# Patient Record
Sex: Female | Born: 1950 | Race: White | Hispanic: No | Marital: Married | Smoking: Current every day smoker
Health system: Southern US, Community
[De-identification: ages and names within clinical notes are randomized; demographics above are authoritative.]

## PROBLEM LIST (undated history)

## (undated) DIAGNOSIS — K5790 Diverticulosis of intestine, part unspecified, without perforation or abscess without bleeding: Secondary | ICD-10-CM

## (undated) DIAGNOSIS — J439 Emphysema, unspecified: Secondary | ICD-10-CM

## (undated) DIAGNOSIS — A0472 Enterocolitis due to Clostridium difficile, not specified as recurrent: Secondary | ICD-10-CM

## (undated) DIAGNOSIS — K2270 Barrett's esophagus without dysplasia: Secondary | ICD-10-CM

## (undated) DIAGNOSIS — I10 Essential (primary) hypertension: Secondary | ICD-10-CM

## (undated) HISTORY — PX: ABDOMINAL HYSTERECTOMY: SHX81

---

## 2017-06-24 ENCOUNTER — Other Ambulatory Visit
Admission: RE | Admit: 2017-06-24 | Discharge: 2017-06-24 | Disposition: A | Discharge status: Home / Self Care | Payer: Medicare Other | Source: Ambulatory Visit | Attending: Family Medicine | Admitting: Family Medicine

## 2017-06-24 DIAGNOSIS — A0472 Enterocolitis due to Clostridium difficile, not specified as recurrent: Secondary | ICD-10-CM

## 2017-06-24 DIAGNOSIS — R197 Diarrhea, unspecified: Secondary | ICD-10-CM | POA: Diagnosis present

## 2017-06-24 HISTORY — DX: Enterocolitis due to Clostridium difficile, not specified as recurrent: A04.72

## 2017-06-24 LAB — AMYLASE: AMYLASE: 90 U/L (ref 28–100)

## 2017-06-24 LAB — COMPREHENSIVE METABOLIC PANEL
ALK PHOS: 55 U/L (ref 38–126)
ALT: 19 U/L (ref 14–54)
AST: 29 U/L (ref 15–41)
Albumin: 4.6 g/dL (ref 3.5–5.0)
Anion gap: 10 (ref 5–15)
BUN: 11 mg/dL (ref 6–20)
CHLORIDE: 97 mmol/L — AB (ref 101–111)
CO2: 26 mmol/L (ref 22–32)
CREATININE: 0.7 mg/dL (ref 0.44–1.00)
Calcium: 9.3 mg/dL (ref 8.9–10.3)
GFR calc Af Amer: >60 mL/min (ref >60)
Glucose, Bld: 184 mg/dL — ABNORMAL HIGH (ref 65–99)
Potassium: 3.6 mmol/L (ref 3.5–5.1)
Sodium: 133 mmol/L — ABNORMAL LOW (ref 135–145)
Total Bilirubin: 0.4 mg/dL (ref 0.3–1.2)
Total Protein: 7.7 g/dL (ref 6.5–8.1)

## 2017-06-24 LAB — CBC WITH DIFFERENTIAL/PLATELET
BASOS ABS: 0.1 10*3/uL (ref 0–0.1)
Basophils Relative: 1 %
Eosinophils Absolute: 0.2 10*3/uL (ref 0–0.7)
Eosinophils Relative: 2 %
HCT: 40.9 % (ref 35.0–47.0)
HEMOGLOBIN: 13.6 g/dL (ref 12.0–16.0)
LYMPHS ABS: 3.2 10*3/uL (ref 1.0–3.6)
LYMPHS PCT: 38 %
MCH: 29.8 pg (ref 26.0–34.0)
MCHC: 33.4 g/dL (ref 32.0–36.0)
MCV: 89.4 fL (ref 80.0–100.0)
Monocytes Absolute: 0.5 10*3/uL (ref 0.2–0.9)
Monocytes Relative: 6 %
NEUTROS ABS: 4.3 10*3/uL (ref 1.4–6.5)
NEUTROS PCT: 53 %
PLATELETS: 331 10*3/uL (ref 150–440)
RBC: 4.58 MIL/uL (ref 3.80–5.20)
RDW: 13.4 % (ref 11.5–14.5)
WBC: 8.2 10*3/uL (ref 3.6–11.0)

## 2017-06-24 LAB — LIPASE, BLOOD: Lipase: 33 U/L (ref 11–51)

## 2017-06-25 ENCOUNTER — Other Ambulatory Visit
Admission: RE | Admit: 2017-06-25 | Discharge: 2017-06-25 | Disposition: A | Discharge status: Home / Self Care | Payer: Medicare Other | Source: Ambulatory Visit | Attending: Family Medicine | Admitting: Family Medicine

## 2017-06-25 DIAGNOSIS — R197 Diarrhea, unspecified: Secondary | ICD-10-CM | POA: Diagnosis present

## 2017-06-25 LAB — GASTROINTESTINAL PANEL BY PCR, STOOL (REPLACES STOOL CULTURE)
ADENOVIRUS F40/41: NOT DETECTED
ASTROVIRUS: NOT DETECTED
CYCLOSPORA CAYETANENSIS: NOT DETECTED
Campylobacter species: NOT DETECTED
Cryptosporidium: NOT DETECTED
ENTEROPATHOGENIC E COLI (EPEC): NOT DETECTED
ENTEROTOXIGENIC E COLI (ETEC): NOT DETECTED
Entamoeba histolytica: NOT DETECTED
Enteroaggregative E coli (EAEC): NOT DETECTED
Giardia lamblia: NOT DETECTED
NOROVIRUS GI/GII: NOT DETECTED
PLESIMONAS SHIGELLOIDES: NOT DETECTED
Rotavirus A: NOT DETECTED
Salmonella species: NOT DETECTED
Sapovirus (I, II, IV, and V): NOT DETECTED
Shiga like toxin producing E coli (STEC): NOT DETECTED
Shigella/Enteroinvasive E coli (EIEC): NOT DETECTED
VIBRIO CHOLERAE: NOT DETECTED
VIBRIO SPECIES: NOT DETECTED
Yersinia enterocolitica: NOT DETECTED

## 2017-06-25 LAB — C DIFFICILE QUICK SCREEN W PCR REFLEX
C DIFFICILE (CDIFF) TOXIN: POSITIVE — AB
C DIFFICLE (CDIFF) ANTIGEN: POSITIVE — AB
C Diff interpretation: DETECTED

## 2017-06-30 ENCOUNTER — Encounter: Payer: Self-pay | Admitting: Emergency Medicine

## 2017-06-30 ENCOUNTER — Other Ambulatory Visit: Payer: Self-pay

## 2017-06-30 ENCOUNTER — Emergency Department: Payer: Medicare Other

## 2017-06-30 DIAGNOSIS — A0472 Enterocolitis due to Clostridium difficile, not specified as recurrent: Secondary | ICD-10-CM | POA: Insufficient documentation

## 2017-06-30 DIAGNOSIS — R1084 Generalized abdominal pain: Secondary | ICD-10-CM | POA: Diagnosis not present

## 2017-06-30 DIAGNOSIS — Z79899 Other long term (current) drug therapy: Secondary | ICD-10-CM | POA: Insufficient documentation

## 2017-06-30 DIAGNOSIS — I10 Essential (primary) hypertension: Secondary | ICD-10-CM | POA: Insufficient documentation

## 2017-06-30 DIAGNOSIS — F17210 Nicotine dependence, cigarettes, uncomplicated: Secondary | ICD-10-CM | POA: Insufficient documentation

## 2017-06-30 DIAGNOSIS — K3533 Acute appendicitis with perforation and localized peritonitis, with abscess: Secondary | ICD-10-CM | POA: Diagnosis not present

## 2017-06-30 LAB — BASIC METABOLIC PANEL
Anion gap: 10 (ref 5–15)
BUN: 8 mg/dL (ref 6–20)
CALCIUM: 10.1 mg/dL (ref 8.9–10.3)
CO2: 25 mmol/L (ref 22–32)
CREATININE: 0.6 mg/dL (ref 0.44–1.00)
Chloride: 99 mmol/L — ABNORMAL LOW (ref 101–111)
GFR calc Af Amer: >60 mL/min (ref >60)
Glucose, Bld: 90 mg/dL (ref 65–99)
Potassium: 3.3 mmol/L — ABNORMAL LOW (ref 3.5–5.1)
SODIUM: 134 mmol/L — AB (ref 135–145)

## 2017-06-30 LAB — CBC
HCT: 39.9 % (ref 35.0–47.0)
Hemoglobin: 13.5 g/dL (ref 12.0–16.0)
MCH: 30.2 pg (ref 26.0–34.0)
MCHC: 33.9 g/dL (ref 32.0–36.0)
MCV: 89.1 fL (ref 80.0–100.0)
PLATELETS: 332 10*3/uL (ref 150–440)
RBC: 4.48 MIL/uL (ref 3.80–5.20)
RDW: 13.2 % (ref 11.5–14.5)
WBC: 8.8 10*3/uL (ref 3.6–11.0)

## 2017-06-30 LAB — LIPASE, BLOOD: LIPASE: 27 U/L (ref 11–51)

## 2017-06-30 LAB — TROPONIN I: Troponin I: <0.03 ng/mL (ref <0.03)

## 2017-06-30 MED ORDER — OXYCODONE-ACETAMINOPHEN 5-325 MG PO TABS
1.0000 | ORAL_TABLET | Freq: Once | ORAL | Status: AC
Start: 2017-06-30 — End: 2017-06-30
  Administered 2017-06-30: 1 via ORAL
  Filled 2017-06-30: qty 1

## 2017-06-30 MED ORDER — ONDANSETRON 4 MG PO TBDP
4.0000 mg | ORAL_TABLET | Freq: Once | ORAL | Status: AC
  Administered 2017-06-30: 4 mg via ORAL
  Filled 2017-06-30: qty 1

## 2017-06-30 NOTE — ED Triage Notes (Signed)
Pt reports epigastric pain since 515pm; pt says pain now radiates up into her chest and into her back as well; pt says she was just resting when pain started; pt says she tested positive for c-diff on Monday; taking antibiotics for same;

## 2017-07-01 ENCOUNTER — Emergency Department
Admission: EM | Admit: 2017-07-01 | Discharge: 2017-07-01 | Disposition: A | Discharge status: Home / Self Care | Payer: Medicare Other | Source: Home / Self Care | Attending: Emergency Medicine | Admitting: Emergency Medicine

## 2017-07-01 DIAGNOSIS — A0472 Enterocolitis due to Clostridium difficile, not specified as recurrent: Secondary | ICD-10-CM

## 2017-07-01 HISTORY — DX: Barrett's esophagus without dysplasia: K22.70

## 2017-07-01 HISTORY — DX: Emphysema, unspecified: J43.9

## 2017-07-01 HISTORY — DX: Enterocolitis due to Clostridium difficile, not specified as recurrent: A04.72

## 2017-07-01 HISTORY — DX: Diverticulosis of intestine, part unspecified, without perforation or abscess without bleeding: K57.90

## 2017-07-01 HISTORY — DX: Essential (primary) hypertension: I10

## 2017-07-01 LAB — HEPATIC FUNCTION PANEL
ALBUMIN: 4.8 g/dL (ref 3.5–5.0)
ALT: 19 U/L (ref 14–54)
AST: 30 U/L (ref 15–41)
Alkaline Phosphatase: 56 U/L (ref 38–126)
Bilirubin, Direct: <0.1 mg/dL — ABNORMAL LOW (ref 0.1–0.5)
TOTAL PROTEIN: 7.9 g/dL (ref 6.5–8.1)
Total Bilirubin: 0.5 mg/dL (ref 0.3–1.2)

## 2017-07-01 MED ORDER — METRONIDAZOLE 500 MG PO TABS
500.0000 mg | ORAL_TABLET | Freq: Once | ORAL | Status: AC
  Administered 2017-07-01: 500 mg via ORAL
  Filled 2017-07-01: qty 1

## 2017-07-01 MED ORDER — METRONIDAZOLE 500 MG PO TABS: 500.0000 mg | ORAL_TABLET | Freq: Four times a day (QID) | ORAL | 0 refills | Status: DC

## 2017-07-01 NOTE — Discharge Instructions (Signed)
Please continue taking your home vancomycin but add on Flagyl in addition.  Follow-up with your primary care physician within 2 days for recheck and return to the emergency department sooner for any new or worsening symptoms such as fevers, chills, worsening pain, if you cannot eat or drink, or for any other issues whatsoever.  It was a pleasure to take care of you today, and thank you for coming to our emergency department.  If you have any questions or concerns before leaving please ask the nurse to grab me and I'm more than happy to go through your aftercare instructions again.  If you were prescribed any opioid pain medication today such as Norco, Vicodin, Percocet, morphine, hydrocodone, or oxycodone please make sure you do not drive when you are taking this medication as it can alter your ability to drive safely.  If you have any concerns once you are home that you are not improving or are in fact getting worse before you can make it to your follow-up appointment, please do not hesitate to call 911 and come back for further evaluation.  Merrily BrittleNeil Snyder Colavito, MD  Results for orders placed or performed during the hospital encounter of 07/01/17  Basic metabolic panel  Result Value Ref Range   Sodium 134 (L) 135 - 145 mmol/L   Potassium 3.3 (L) 3.5 - 5.1 mmol/L   Chloride 99 (L) 101 - 111 mmol/L   CO2 25 22 - 32 mmol/L   Glucose, Bld 90 65 - 99 mg/dL   BUN 8 6 - 20 mg/dL   Creatinine, Ser 1.190.60 0.44 - 1.00 mg/dL   Calcium 14.710.1 8.9 - 82.910.3 mg/dL   GFR calc non Af Amer >60 >60 mL/min   GFR calc Af Amer >60 >60 mL/min   Anion gap 10 5 - 15  CBC  Result Value Ref Range   WBC 8.8 3.6 - 11.0 K/uL   RBC 4.48 3.80 - 5.20 MIL/uL   Hemoglobin 13.5 12.0 - 16.0 g/dL   HCT 56.239.9 13.035.0 - 86.547.0 %   MCV 89.1 80.0 - 100.0 fL   MCH 30.2 26.0 - 34.0 pg   MCHC 33.9 32.0 - 36.0 g/dL   RDW 78.413.2 69.611.5 - 29.514.5 %   Platelets 332 150 - 440 K/uL  Troponin I  Result Value Ref Range   Troponin I <0.03 <0.03 ng/mL    Lipase, blood  Result Value Ref Range   Lipase 27 11 - 51 U/L  Hepatic function panel  Result Value Ref Range   Total Protein 7.9 6.5 - 8.1 g/dL   Albumin 4.8 3.5 - 5.0 g/dL   AST 30 15 - 41 U/L   ALT 19 14 - 54 U/L   Alkaline Phosphatase 56 38 - 126 U/L   Total Bilirubin 0.5 0.3 - 1.2 mg/dL   Bilirubin, Direct <2.8<0.1 (L) 0.1 - 0.5 mg/dL   Indirect Bilirubin NOT CALCULATED 0.3 - 0.9 mg/dL   Dg Chest 2 View  Result Date: 06/30/2017 CLINICAL DATA:  Epigastric chest pain since this evening radiating to back. EXAM: CHEST - 2 VIEW COMPARISON:  None. FINDINGS: Cardiomediastinal silhouette is normal. No pleural effusions or focal consolidations. Mild hyperinflation and chronic interstitial changes. Trachea projects midline and there is no pneumothorax. Soft tissue planes and included osseous structures are non-suspicious. IMPRESSION: .  No focal consolidation.  COPD Electronically Signed   By: Awilda Metroourtnay  Bloomer M.D.   On: 06/30/2017 23:37

## 2017-07-01 NOTE — ED Provider Notes (Signed)
[facility] Emergency Department Provider Note  ____________________________________________   First MD Initiated Contact with Patient 07/01/17 0107     (approximate)  I have reviewed the triage vital signs and the nursing notes.   HISTORY  Chief Complaint Abdominal Pain and Chest Pain   HPI Donna Hammond is a 67 y.o. female who comes to the emergency department with diffuse cramping mild abdominal discomfort in her upper abdomen rising into her chest as well as her back.  She has had similar symptoms for the past week or so and was diagnosed with C. difficile colitis as an outpatient.  For the past 4 days she has been taking oral vancomycin however her symptoms have persisted.  She denies fevers or chills.  She denies nausea or vomiting.  Her symptoms worsened somewhat suddenly today which prompted the visit to the emergency department.  She has a remote history of abdominal hysterectomy.  She does report some mild lower chest discomfort.  Nonexertional.  She does persist with several loose stools a day.  Past Medical History:  Diagnosis Date  . Barrett esophagus   . Clostridium difficile diarrhea 06/24/2017  . Diverticulosis   . Emphysema lung (HCC)   . Hypertension     Patient Active Problem List   Diagnosis Date Noted  . Acute perforated appendicitis 07/02/2017  . Ruptured appendicitis   . Enteritis due to Clostridium difficile     Past Surgical History:  Procedure Laterality Date  . ABDOMINAL HYSTERECTOMY      Prior to Admission medications   Medication Sig Start Date End Date Taking? Authorizing Provider  amLODipine (NORVASC) 10 MG tablet Take 10 mg by mouth daily.   Yes [provider]  dicyclomine (BENTYL) 20 MG tablet Take 20 mg by mouth every 6 (six) hours.   Yes [provider]  lisinopril (PRINIVIL,ZESTRIL) 20 MG tablet Take 20 mg by mouth daily.   Yes [provider]  vancomycin (VANCOCIN) 125 MG capsule  Take 125 mg by mouth 4 (four) times daily.   Yes [provider]  metroNIDAZOLE (FLAGYL) 500 MG tablet Take 1 tablet (500 mg total) by mouth 4 (four) times daily for 14 days. 07/01/17 07/15/17  Merrily Brittle, MD    Allergies Levaquin [levofloxacin in d5w]  History reviewed. No pertinent family history.  Social History Social History   Tobacco Use  . Smoking status: Current Every Day Smoker    Packs/day: 0.50    Types: Cigarettes  . Smokeless tobacco: Never Used  Substance Use Topics  . Alcohol use: Never    Frequency: Never  . Drug use: Never    Review of Systems Constitutional: No fever/chills Eyes: No visual changes. ENT: No sore throat. Cardiovascular: Denies chest pain. Respiratory: Denies shortness of breath. Gastrointestinal: Positive for abdominal pain.  Positive for nausea, no vomiting.  Positive for diarrhea.  No constipation. Genitourinary: Negative for dysuria. Musculoskeletal: Negative for back pain. Skin: Negative for rash. Neurological: Negative for headaches, focal weakness or numbness.   ____________________________________________   PHYSICAL EXAM:  VITAL SIGNS: ED Triage Vitals  Enc Vitals Group     BP 06/30/17 2212 (!) 146/74     Pulse Rate 06/30/17 2212 98     Resp 06/30/17 2212 20     Temp 06/30/17 2212 98.6 F (37 C)     Temp src --      SpO2 06/30/17 2212 100 %     Weight 06/30/17 2213 120 lb (54.4 kg)  Height 06/30/17 2213 5' 2.5" (1.588 m)     Head Circumference --      Peak Flow --      Pain Score 06/30/17 2220 7     Pain Loc --      Pain Edu? --      Excl. in GC? --     Constitutional: Alert and oriented x4 well-appearing nontoxic no diaphoresis speaks in full clear sentences Eyes: PERRL EOMI. Head: Atraumatic. Nose: No congestion/rhinnorhea. Mouth/Throat: No trismus Neck: No stridor.   Cardiovascular: Normal rate, regular rhythm. Grossly normal heart sounds.  Good peripheral circulation. Respiratory: Normal  respiratory effort.  No retractions. Lungs CTAB and moving good air Gastrointestinal: Soft abdomen very mild diffuse upper abdominal tenderness with no rebound or guarding no peritonitis.  No McBurney's tenderness negative Rovsing's Musculoskeletal: No lower extremity edema   Neurologic:  Normal speech and language. No gross focal neurologic deficits are appreciated. Skin:  Skin is warm, dry and intact. No rash noted. Psychiatric: Mood and affect are normal. Speech and behavior are normal.    ____________________________________________   DIFFERENTIAL includes but not limited to  C. difficile colitis, toxic megacolon, dehydration, pyelonephritis, appendicitis ____________________________________________   LABS (all labs ordered are listed, but only abnormal results are displayed)  Labs Reviewed  BASIC METABOLIC PANEL - Abnormal; Notable for the following components:      Result Value   Sodium 134 (*)    Potassium 3.3 (*)    Chloride 99 (*)    All other components within normal limits  HEPATIC FUNCTION PANEL - Abnormal; Notable for the following components:   Bilirubin, Direct <0.1 (*)    All other components within normal limits  CBC  TROPONIN I  LIPASE, BLOOD    Lab work reviewed by me with no acute disease __________________________________________  EKG   ____________________________________________  RADIOLOGY   ____________________________________________   PROCEDURES  Procedure(s) performed: no  Procedures  Critical Care performed: no  Observation: no ____________________________________________   INITIAL IMPRESSION / ASSESSMENT AND PLAN / ED COURSE  Pertinent labs & imaging results that were available during my care of the patient were reviewed by me and considered in my medical decision making (see chart for details).  The patient arrives hemodynamically stable and quite well-appearing, hemodynamically stable, with a benign abdominal exam.  She  does have a recent diagnosis of C. difficile colitis and has been compliant with her vancomycin although reports mostly persistent symptoms.  She is clinically not dehydrated.  At this point I think it is most reasonable to add on oral Flagyl in addition to the vancomycin and have the patient follow-up with her primary care physician within 2 days for recheck.  I do not believe the patient requires advanced imaging at this time given her benign abdominal exam and how she is able to keep down food and water.  Strict return precautions have been given and the patient verbalizes understanding and agreement with the plan.      ____________________________________________   FINAL CLINICAL IMPRESSION(S) / ED DIAGNOSES  Final diagnoses:  C. difficile colitis      NEW MEDICATIONS STARTED DURING THIS VISIT:  Discharge Medication List as of 07/01/2017  2:02 AM    START taking these medications   Details  metroNIDAZOLE (FLAGYL) 500 MG tablet Take 1 tablet (500 mg total) by mouth 4 (four) times daily for 14 days., Starting Mon 07/01/2017, Until Mon 07/15/2017, Print         Note:  This document was  prepared using Conservation officer, historic buildings and may include unintentional dictation errors.     Merrily Brittle, MD 07/03/17 1555

## 2017-07-01 NOTE — ED Notes (Signed)
ED Provider at bedside. 

## 2017-07-02 ENCOUNTER — Inpatient Hospital Stay
Admission: EM | Admit: 2017-07-02 | Discharge: 2017-07-17 | DRG: 339 | Disposition: A | Discharge status: Home / Self Care | Payer: Medicare Other | Attending: Surgery | Admitting: Surgery

## 2017-07-02 ENCOUNTER — Other Ambulatory Visit: Payer: Self-pay

## 2017-07-02 ENCOUNTER — Emergency Department: Payer: Medicare Other

## 2017-07-02 DIAGNOSIS — F17210 Nicotine dependence, cigarettes, uncomplicated: Secondary | ICD-10-CM | POA: Diagnosis present

## 2017-07-02 DIAGNOSIS — F419 Anxiety disorder, unspecified: Secondary | ICD-10-CM | POA: Diagnosis present

## 2017-07-02 DIAGNOSIS — K3533 Acute appendicitis with perforation and localized peritonitis, with abscess: Secondary | ICD-10-CM | POA: Diagnosis present

## 2017-07-02 DIAGNOSIS — K3532 Acute appendicitis with perforation and localized peritonitis, without abscess: Secondary | ICD-10-CM | POA: Insufficient documentation

## 2017-07-02 DIAGNOSIS — K219 Gastro-esophageal reflux disease without esophagitis: Secondary | ICD-10-CM | POA: Diagnosis present

## 2017-07-02 DIAGNOSIS — I10 Essential (primary) hypertension: Secondary | ICD-10-CM | POA: Diagnosis present

## 2017-07-02 DIAGNOSIS — R1084 Generalized abdominal pain: Secondary | ICD-10-CM | POA: Diagnosis present

## 2017-07-02 DIAGNOSIS — R188 Other ascites: Secondary | ICD-10-CM | POA: Diagnosis present

## 2017-07-02 DIAGNOSIS — J449 Chronic obstructive pulmonary disease, unspecified: Secondary | ICD-10-CM | POA: Diagnosis present

## 2017-07-02 DIAGNOSIS — Z79899 Other long term (current) drug therapy: Secondary | ICD-10-CM | POA: Diagnosis not present

## 2017-07-02 DIAGNOSIS — K5730 Diverticulosis of large intestine without perforation or abscess without bleeding: Secondary | ICD-10-CM | POA: Diagnosis present

## 2017-07-02 DIAGNOSIS — Z90710 Acquired absence of both cervix and uterus: Secondary | ICD-10-CM | POA: Diagnosis not present

## 2017-07-02 DIAGNOSIS — K9189 Other postprocedural complications and disorders of digestive system: Secondary | ICD-10-CM

## 2017-07-02 DIAGNOSIS — K567 Ileus, unspecified: Secondary | ICD-10-CM

## 2017-07-02 DIAGNOSIS — Z881 Allergy status to other antibiotic agents status: Secondary | ICD-10-CM

## 2017-07-02 DIAGNOSIS — E871 Hypo-osmolality and hyponatremia: Secondary | ICD-10-CM | POA: Diagnosis present

## 2017-07-02 DIAGNOSIS — Z4659 Encounter for fitting and adjustment of other gastrointestinal appliance and device: Secondary | ICD-10-CM

## 2017-07-02 DIAGNOSIS — A0472 Enterocolitis due to Clostridium difficile, not specified as recurrent: Secondary | ICD-10-CM | POA: Diagnosis present

## 2017-07-02 LAB — COMPREHENSIVE METABOLIC PANEL
ALT: 17 U/L (ref 14–54)
AST: 34 U/L (ref 15–41)
Albumin: 4.1 g/dL (ref 3.5–5.0)
Alkaline Phosphatase: 54 U/L (ref 38–126)
Anion gap: 8 (ref 5–15)
BUN: 10 mg/dL (ref 6–20)
CALCIUM: 9 mg/dL (ref 8.9–10.3)
CO2: 26 mmol/L (ref 22–32)
CREATININE: 0.81 mg/dL (ref 0.44–1.00)
Chloride: 95 mmol/L — ABNORMAL LOW (ref 101–111)
Glucose, Bld: 189 mg/dL — ABNORMAL HIGH (ref 65–99)
Potassium: 3.8 mmol/L (ref 3.5–5.1)
Sodium: 129 mmol/L — ABNORMAL LOW (ref 135–145)
Total Bilirubin: 0.8 mg/dL (ref 0.3–1.2)
Total Protein: 6.9 g/dL (ref 6.5–8.1)

## 2017-07-02 LAB — CBC
HCT: 38.8 % (ref 35.0–47.0)
Hemoglobin: 12.8 g/dL (ref 12.0–16.0)
MCH: 29.6 pg (ref 26.0–34.0)
MCHC: 32.9 g/dL (ref 32.0–36.0)
MCV: 90.1 fL (ref 80.0–100.0)
PLATELETS: 335 10*3/uL (ref 150–440)
RBC: 4.3 MIL/uL (ref 3.80–5.20)
RDW: 13.2 % (ref 11.5–14.5)
WBC: 13.8 10*3/uL — ABNORMAL HIGH (ref 3.6–11.0)

## 2017-07-02 LAB — LIPASE, BLOOD: LIPASE: 20 U/L (ref 11–51)

## 2017-07-02 MED ORDER — ENOXAPARIN SODIUM 40 MG/0.4ML ~~LOC~~ SOLN
SUBCUTANEOUS | Status: AC
  Administered 2017-07-02: 40 mg via SUBCUTANEOUS
  Filled 2017-07-02: qty 0.4

## 2017-07-02 MED ORDER — KETOROLAC TROMETHAMINE 30 MG/ML IJ SOLN
INTRAMUSCULAR | Status: AC
  Administered 2017-07-02: 30 mg via INTRAVENOUS
  Filled 2017-07-02: qty 1

## 2017-07-02 MED ORDER — HYDROMORPHONE HCL 1 MG/ML IJ SOLN
0.5000 mg | INTRAMUSCULAR | Status: DC | PRN
  Administered 2017-07-08: 0.5 mg via INTRAVENOUS
  Filled 2017-07-02: qty 0.5

## 2017-07-02 MED ORDER — METRONIDAZOLE IN NACL 5-0.79 MG/ML-% IV SOLN: 500.0000 mg | Freq: Three times a day (TID) | INTRAVENOUS | Status: DC

## 2017-07-02 MED ORDER — METRONIDAZOLE IN NACL 5-0.79 MG/ML-% IV SOLN
500.0000 mg | INTRAVENOUS | Status: AC
  Administered 2017-07-02: 500 mg via INTRAVENOUS
  Filled 2017-07-02: qty 100

## 2017-07-02 MED ORDER — SODIUM CHLORIDE 0.9 % IV BOLUS
1000.0000 mL | Freq: Once | INTRAVENOUS | Status: AC
  Administered 2017-07-02: 1000 mL via INTRAVENOUS

## 2017-07-02 MED ORDER — ONDANSETRON HCL 4 MG/2ML IJ SOLN: 4.0000 mg | Freq: Four times a day (QID) | INTRAMUSCULAR | Status: DC | PRN

## 2017-07-02 MED ORDER — ACETAMINOPHEN 500 MG PO TABS
ORAL_TABLET | ORAL | Status: AC
  Administered 2017-07-02: 1000 mg via ORAL
  Filled 2017-07-02: qty 2

## 2017-07-02 MED ORDER — MORPHINE SULFATE (PF) 4 MG/ML IV SOLN
4.0000 mg | Freq: Once | INTRAVENOUS | Status: AC
  Administered 2017-07-02: 4 mg via INTRAVENOUS

## 2017-07-02 MED ORDER — ENOXAPARIN SODIUM 40 MG/0.4ML ~~LOC~~ SOLN
40.0000 mg | SUBCUTANEOUS | Status: DC
  Administered 2017-07-02 – 2017-07-16 (×14): 40 mg via SUBCUTANEOUS
  Filled 2017-07-02 (×14): qty 0.4

## 2017-07-02 MED ORDER — LACTATED RINGERS IV SOLN
INTRAVENOUS | Status: DC
  Administered 2017-07-02: 1000 mL via INTRAVENOUS
  Administered 2017-07-03 – 2017-07-04 (×4): via INTRAVENOUS

## 2017-07-02 MED ORDER — METRONIDAZOLE 500 MG PO TABS
500.0000 mg | ORAL_TABLET | Freq: Three times a day (TID) | ORAL | Status: DC
  Administered 2017-07-02 – 2017-07-09 (×19): 500 mg via ORAL
  Filled 2017-07-02 (×23): qty 1

## 2017-07-02 MED ORDER — ONDANSETRON HCL 4 MG/2ML IJ SOLN
4.0000 mg | Freq: Once | INTRAMUSCULAR | Status: AC
  Administered 2017-07-02: 4 mg via INTRAVENOUS
  Filled 2017-07-02: qty 2

## 2017-07-02 MED ORDER — OXYCODONE HCL 5 MG PO TABS
5.0000 mg | ORAL_TABLET | ORAL | Status: DC | PRN
  Administered 2017-07-06: 5 mg via ORAL
  Administered 2017-07-06: 10 mg via ORAL
  Administered 2017-07-07 – 2017-07-08 (×2): 5 mg via ORAL
  Filled 2017-07-02: qty 2
  Filled 2017-07-02 (×2): qty 1
  Filled 2017-07-02: qty 2

## 2017-07-02 MED ORDER — MORPHINE SULFATE (PF) 4 MG/ML IV SOLN
INTRAVENOUS | Status: AC
  Administered 2017-07-02: 4 mg via INTRAVENOUS
  Filled 2017-07-02: qty 1

## 2017-07-02 MED ORDER — LISINOPRIL 20 MG PO TABS
20.0000 mg | ORAL_TABLET | Freq: Every day | ORAL | Status: DC
  Filled 2017-07-02 (×2): qty 1

## 2017-07-02 MED ORDER — ONDANSETRON 4 MG PO TBDP: 4.0000 mg | ORAL_TABLET | Freq: Four times a day (QID) | ORAL | Status: DC | PRN

## 2017-07-02 MED ORDER — KETOROLAC TROMETHAMINE 30 MG/ML IJ SOLN
30.0000 mg | Freq: Four times a day (QID) | INTRAMUSCULAR | Status: AC
  Administered 2017-07-02 – 2017-07-07 (×19): 30 mg via INTRAVENOUS
  Filled 2017-07-02 (×18): qty 1

## 2017-07-02 MED ORDER — VANCOMYCIN 50 MG/ML ORAL SOLUTION
125.0000 mg | Freq: Four times a day (QID) | ORAL | Status: DC
  Administered 2017-07-02 – 2017-07-17 (×56): 125 mg via ORAL
  Filled 2017-07-02 (×63): qty 2.5

## 2017-07-02 MED ORDER — MORPHINE SULFATE (PF) 4 MG/ML IV SOLN
4.0000 mg | Freq: Once | INTRAVENOUS | Status: AC
  Administered 2017-07-02: 4 mg via INTRAVENOUS
  Filled 2017-07-02: qty 1

## 2017-07-02 MED ORDER — PIPERACILLIN-TAZOBACTAM 3.375 G IVPB
3.3750 g | Freq: Three times a day (TID) | INTRAVENOUS | Status: DC
  Administered 2017-07-02 – 2017-07-17 (×44): 3.375 g via INTRAVENOUS
  Filled 2017-07-02 (×43): qty 50

## 2017-07-02 MED ORDER — SODIUM CHLORIDE 0.9 % IV SOLN
2.0000 g | INTRAVENOUS | Status: AC
  Administered 2017-07-02: 2 g via INTRAVENOUS
  Filled 2017-07-02: qty 2

## 2017-07-02 MED ORDER — IOPAMIDOL (ISOVUE-300) INJECTION 61%
100.0000 mL | Freq: Once | INTRAVENOUS | Status: AC | PRN
  Administered 2017-07-02: 100 mL via INTRAVENOUS

## 2017-07-02 MED ORDER — ACETAMINOPHEN 500 MG PO TABS
1000.0000 mg | ORAL_TABLET | Freq: Four times a day (QID) | ORAL | Status: DC
  Administered 2017-07-02 – 2017-07-03 (×4): 1000 mg via ORAL
  Filled 2017-07-02 (×5): qty 2

## 2017-07-02 MED ORDER — PANTOPRAZOLE SODIUM 40 MG IV SOLR
40.0000 mg | Freq: Every day | INTRAVENOUS | Status: DC
  Administered 2017-07-02 – 2017-07-15 (×14): 40 mg via INTRAVENOUS
  Filled 2017-07-02 (×14): qty 40

## 2017-07-02 MED ORDER — AMLODIPINE BESYLATE 10 MG PO TABS
10.0000 mg | ORAL_TABLET | Freq: Every day | ORAL | Status: DC
  Filled 2017-07-02 (×2): qty 1

## 2017-07-02 NOTE — ED Notes (Signed)
Patient transported to CT 

## 2017-07-02 NOTE — ED Notes (Signed)
Assisted patient to bathroom, unable to have BM or urine sample.

## 2017-07-02 NOTE — ED Notes (Addendum)
Attempt to call report, unsuccessful.  

## 2017-07-02 NOTE — ED Notes (Signed)
Family at bedside. Given mouth swabs per request of dry mouth. Family attentive to patient.

## 2017-07-02 NOTE — H&P (Signed)
Date of Admission:  07/02/2017  Reason for Admission:  Ruptured appendicitis and clostridium difficile colitis  History of Present Illness: Donna Hammond is a 67 y.o. female with a recent history of c-diff colitis, being treated as an outpatient with oral vancomycin and flagyl.  She reports about 1 week ago starting to have profuse diarrhea with some abdominal discomfort.  She was sent to Urgent Care and tested positive for c-diff and was started on oral vancomycin.  She then started having worse upper abdominal pain about 3 days ago and presented back to the ED and Flagyl was added to her treatment plan.  She reports the pain was almost at the chest and she thought she was having a heart attack.  She reports that today the pain in her abdomen, particularly the right side, has become unbearable, though her diarrhea has been improving and she's having more formed stool now.  She reports having low grade fever at home of 100.3 and nausea but no vomiting.  Denies any chills, chest pain, shortness of breath.  Denies any dysuria or hematuria.   Of note, she had an antibiotic course about two months ago for sinusitis and bronchitis.  Past Medical History: Past Medical History:  Diagnosis Date  . Barrett esophagus   . Clostridium difficile diarrhea 06/24/2017  . Diverticulosis   . Emphysema lung (HCC)   . Hypertension      Past Surgical History: Past Surgical History:  Procedure Laterality Date  . ABDOMINAL HYSTERECTOMY      Home Medications: Prior to Admission medications   Medication Sig Start Date End Date Taking? Authorizing Provider  amLODipine (NORVASC) 10 MG tablet Take 10 mg by mouth daily.   Yes [provider]  lisinopril (PRINIVIL,ZESTRIL) 20 MG tablet Take 20 mg by mouth daily.   Yes [provider]  metroNIDAZOLE (FLAGYL) 500 MG tablet Take 1 tablet (500 mg total) by mouth 4 (four) times daily for 14 days. 07/01/17 07/15/17 Yes Merrily Brittle, MD  vancomycin  (VANCOCIN) 125 MG capsule Take 125 mg by mouth 4 (four) times daily.   Yes [provider]  dicyclomine (BENTYL) 20 MG tablet Take 20 mg by mouth every 6 (six) hours.    [provider]    Allergies: Allergies  Allergen Reactions  . Levaquin [Levofloxacin In D5w] Swelling    tingling    Social History:  reports that she has been smoking cigarettes.  She has been smoking about 0.50 packs per day. She has never used smokeless tobacco. She reports that she does not drink alcohol or use drugs.   Family History: No family history of c diff colitis  Review of Systems: Review of Systems  Constitutional: Positive for fever. Negative for chills.  HENT: Negative for hearing loss.   Eyes: Negative for blurred vision.  Respiratory: Negative for shortness of breath.   Cardiovascular: Negative for chest pain.  Gastrointestinal: Positive for abdominal pain, diarrhea and nausea. Negative for constipation and vomiting.  Genitourinary: Negative for dysuria.  Musculoskeletal: Negative for myalgias.  Skin: Negative for rash.  Neurological: Negative for dizziness.  Psychiatric/Behavioral: Negative for depression.  All other systems reviewed and are negative.   Physical Exam BP (!) 118/59 (BP Location: Right Arm)   Pulse (!) 115   Temp 99.3 F (37.4 C) (Oral)   Resp (!) 22   Ht 5\' 2"  (1.575 m)   Wt 120 lb (54.4 kg)   SpO2 97%   BMI 21.95 kg/m  CONSTITUTIONAL: No acute distress  HEENT:  Normocephalic, atraumatic, extraocular motion intact. NECK: Trachea is midline, and there is no jugular venous distension.  RESPIRATORY:  Lungs are clear, and breath sounds are equal bilaterally. Normal respiratory effort without pathologic use of accessory muscles. CARDIOVASCULAR: Heart is regular without murmurs, gallops, or rubs. GI: The abdomen is soft, nondistended, with significant tenderness to palpation over the right abdomen. There were no palpable masses. MUSCULOSKELETAL:  Normal  muscle strength and tone in all four extremities.  No peripheral edema or cyanosis. SKIN: Skin turgor is normal. There are no pathologic skin lesions.  NEUROLOGIC:  Motor and sensation is grossly normal.  Cranial nerves are grossly intact. PSYCH:  Alert and oriented to person, place and time. Affect is normal.  Laboratory Analysis: Results for orders placed or performed during the hospital encounter of 07/02/17 (from the past 24 hour(s))  Lipase, blood     Status: None   Collection Time: 07/02/17  3:18 PM  Result Value Ref Range   Lipase 20 11 - 51 U/L  Comprehensive metabolic panel     Status: Abnormal   Collection Time: 07/02/17  3:18 PM  Result Value Ref Range   Sodium 129 (L) 135 - 145 mmol/L   Potassium 3.8 3.5 - 5.1 mmol/L   Chloride 95 (L) 101 - 111 mmol/L   CO2 26 22 - 32 mmol/L   Glucose, Bld 189 (H) 65 - 99 mg/dL   BUN 10 6 - 20 mg/dL   Creatinine, Ser 1.610.81 0.44 - 1.00 mg/dL   Calcium 9.0 8.9 - 09.610.3 mg/dL   Total Protein 6.9 6.5 - 8.1 g/dL   Albumin 4.1 3.5 - 5.0 g/dL   AST 34 15 - 41 U/L   ALT 17 14 - 54 U/L   Alkaline Phosphatase 54 38 - 126 U/L   Total Bilirubin 0.8 0.3 - 1.2 mg/dL   GFR calc non Af Amer >60 >60 mL/min   GFR calc Af Amer >60 >60 mL/min   Anion gap 8 5 - 15  CBC     Status: Abnormal   Collection Time: 07/02/17  3:18 PM  Result Value Ref Range   WBC 13.8 (H) 3.6 - 11.0 K/uL   RBC 4.30 3.80 - 5.20 MIL/uL   Hemoglobin 12.8 12.0 - 16.0 g/dL   HCT 04.538.8 40.935.0 - 81.147.0 %   MCV 90.1 80.0 - 100.0 fL   MCH 29.6 26.0 - 34.0 pg   MCHC 32.9 32.0 - 36.0 g/dL   RDW 91.413.2 78.211.5 - 95.614.5 %   Platelets 335 150 - 440 K/uL    Imaging: Ct Abdomen Pelvis W Contrast  Result Date: 07/02/2017 CLINICAL DATA:  Worsening right-sided abdominal pain starting yesterday. Recent diagnosis of C. difficile colitis, improving with antibiotics until yesterday. Nausea. EXAM: CT ABDOMEN AND PELVIS WITH CONTRAST TECHNIQUE: Multidetector CT imaging of the abdomen and pelvis was performed  using the standard protocol following bolus administration of intravenous contrast. CONTRAST:  100mL ISOVUE-300 IOPAMIDOL (ISOVUE-300) INJECTION 61% COMPARISON:  Chest radiograph 06/30/2017 FINDINGS: Lower chest: Suspected emphysema. Calcification of the aortic valve. Small type 1 hiatal hernia. Hepatobiliary: Mildly contracted gallbladder. Pancreas: Unremarkable Spleen: Unremarkable Adrenals/Urinary Tract: 3 simple appearing left renal cysts are present. Adrenal glands unremarkable. Stomach/Bowel: The appendix is distended up to 1.4 cm. There are several internal calcifications on image 38/2 as well as 2 peri appendiceal calcifications and periappendiceal fluid in addition to several tiny locules of peri appendiceal gas, highly suspicious for perforated appendicitis. Notable ascites in the right abdomen tracking  in the paracolic gutter, right perihepatic region, porta hepatis, and right paracolic region. There is stool like consistency in the terminal ileum as well as wall thickening in the cecum and ascending colon. There is an unusual transmesenteric internal herniation of transverse colon extending through the mesentery of the splenic flexure/upper descending colon. There is some associated twisting in narrowing of the bowel associated with the herniated portion, but I am skeptical of true obstruction. There is some formed stool in the distal colon as well as distal descending and sigmoid colon diverticulosis without active diverticulitis. Pelvic ascites noted. Vascular/Lymphatic: Aortoiliac atherosclerotic vascular disease. Patent celiac trunk, SMA, and IMA. At the level of the hiatus, a periaortic lymph node measures 0.8 cm in short axis on image 14/2. Reproductive: Uterus absent.  No appreciable adnexal abnormality. Other: Mesenteric and omental edema without appreciable tumor deposition. Musculoskeletal: Endplate sclerosis and degenerative disc disease at L5-S1 with spurring contributing to right foraminal  narrowing at this level. IMPRESSION: 1. Acute ruptured appendicitis, with several tiny locules of gas and several spilled appendicoliths in the periappendiceal region, as well as ascites and extensive appendiceal dilatation. Strictly speaking I cannot exclude an appendiceal mucocele that has ruptured. 2. There is a transmesenteric herniation of transverse colon extending through the mesentery of the splenic flexure/upper descending colon. The herniated bowel loop is twisted but does not appear ischemic at this time. 3. Considerable ascites mainly eccentric to the right side and probably due to the ruptured appendicitis. 4. Other imaging findings of potential clinical significance: Aortic Atherosclerosis (ICD10-I70.0) and Emphysema (ICD10-J43.9). Calcification of the aortic valve. Small type 1 hiatal hernia. Mesenteric and omental edema. Right foraminal impingement at L5-S1. Critical Value/emergent results were called by telephone at the time of interpretation on 07/02/2017 at 4:36 pm to Dr. Sharman Cheek , who verbally acknowledged these results. Electronically Signed   By: Gaylyn Rong M.D.   On: 07/02/2017 16:41    Assessment and Plan: This is a 67 y.o. female who presents with perforated appendicitis in the setting of c-diff colitis.  I have independently viewed the patient's imaging study and reviewed her laboratory studies.  Her appendix is very inflamed with surrounding stranding.  There are appendicoliths in the appendix and one that appears to be extraluminal, likely at the site of perforation.  There is no free air.  There is some free fluid particularly in the pelvis.  There is what appears an internal hernia in the left side but the patient has no pain on the left abdomen.  Discussed with the patient that the treatment for this is largely conservative.  At this point she will be admitted to the surgery team.  She will remain NPO with aggressive IV fluid hydration and appropriate pain and  nausea control.  She will be started on Zosyn to treat her appendicitis, but also will continue with vancomycin and flagyl for her c-diff colitis.  Will place ID consult to help [place] manage her antibiotic regimen.  She understands that we need to treat both at the same time and she may have a prolonged course of antibiotics for her cdiff once her appendicitis is treated.  She does understand also that if there is no improvement or there is worsening in her clinical exam, we may need to repeat CT scan, and there is also a possibility that she may require surgery.  In that setting of ruptured appendicitis with cdiff colitis, she may require more extensive surgery and not just an appendectomy.  For now, will  continue conservative management and will be following her clinical exam closely.  Patient understands this plan and all of her questions have been answered.   Howie Ill, MD [facility]

## 2017-07-02 NOTE — ED Triage Notes (Signed)
Pt to ED via EMS reporting worsening right sided abd pain since yesterday. Pt was dx with Cdif last week and has been complient with antibiotics. Pain was improving until yesterday. EMS administered of fentanyl and pt continues to hunch over and grab at right side of abd reporting discomfort. Nausea onset after arrival to ED. No vomiting. No fevers reported.

## 2017-07-02 NOTE — Progress Notes (Signed)
Pharmacy Antibiotic Note  Donna Hammond is a 67 y.o. female admitted on 07/02/2017 with intra-abdominal infection. Pharmacy has been consulted for Zosyn dosing.  Patient has already received one dose of cefepime and metronidazole in the ED. Vancomycin ordered and will start Zosyn.   Plan: Zosyn 3.375g IV q8h (EI)    Height: 5\' 2"  (157.5 cm) Weight: 120 lb (54.4 kg) IBW/kg (Calculated) : 50.1  Temp (24hrs), Avg:99.3 F (37.4 C), Min:99.3 F (37.4 C), Max:99.3 F (37.4 C)  Recent Labs  Lab 06/30/17 2235 07/02/17 1518  WBC 8.8 13.8*  CREATININE 0.60 0.81    Estimated Creatinine Clearance: 53.3 mL/min (by C-G formula based on SCr of 0.81 mg/dL).    Allergies  Allergen Reactions  . Levaquin [Levofloxacin In D5w] Swelling    tingling    Antimicrobials this admission: 4/2 cefepime >> once 4/2 metronidazole >>  4/2 vancomycin >> 4/2 Zosyn >>  Dose adjustments this admission:   Microbiology results:   Thank you for allowing pharmacy to be a part of this patient's care.  Cleopatra CedarStephanie Rainbow Salman, PharmD Pharmacy Resident  07/02/2017 6:21 PM

## 2017-07-02 NOTE — ED Provider Notes (Signed)
[facility] Emergency Department Provider Note  ____________________________________________  Time seen: Approximately 4:26 PM  I have reviewed the triage vital signs and the nursing notes.   HISTORY  Chief Complaint Abdominal Pain    HPI Donna Hammond is a 67 y.o. female who complains of severe abdominal pain that started yesterday, gradual onset, continuous, worsening, nonradiating, generalized. Associated with fever of 100.3 at home, nausea but no vomiting. She has had diarrhea recently and been treated for C. difficile with oral vancomycin for the past week. She also started Flagyl yesterday. She does report that her diarrhea has been improving the last 2 days. No aggravating or alleviating factors to her abdominal pain      Past Medical History:  Diagnosis Date  . Barrett esophagus   . Clostridium difficile diarrhea 06/24/2017  . Diverticulosis   . Emphysema lung (HCC)   . Hypertension      There are no active problems to display for this patient.    Past Surgical History:  Procedure Laterality Date  . ABDOMINAL HYSTERECTOMY    status post TAH/BSO   Prior to Admission medications   Medication Sig Start Date End Date Taking? Authorizing Provider  amLODipine (NORVASC) 10 MG tablet Take 10 mg by mouth daily.    [provider]  dicyclomine (BENTYL) 20 MG tablet Take 20 mg by mouth every 6 (six) hours.    [provider]  lisinopril (PRINIVIL,ZESTRIL) 20 MG tablet Take 20 mg by mouth daily.    [provider]  metroNIDAZOLE (FLAGYL) 500 MG tablet Take 1 tablet (500 mg total) by mouth 4 (four) times daily for 14 days. 07/01/17 07/15/17  Merrily Brittleifenbark, Neil, MD  vancomycin (VANCOCIN) 125 MG capsule Take 125 mg by mouth 4 (four) times daily.    [provider]     Allergies Patient has no known allergies.   No family history on file.  Social History Social History   Tobacco Use  . Smoking status: Current  Every Day Smoker    Packs/day: 0.50    Types: Cigarettes  . Smokeless tobacco: Never Used  Substance Use Topics  . Alcohol use: Never    Frequency: Never  . Drug use: Never    Review of Systems  Constitutional:   positive fever  ENT:   No sore throat. No rhinorrhea. Cardiovascular:   No chest pain or syncope. Respiratory:   No dyspnea or cough. Gastrointestinal:   positive generalized abdominal pain as above with diarrhea. No vomiting.  Musculoskeletal:   Negative for focal pain or swelling All other systems reviewed and are negative except as documented above in ROS and HPI.  ____________________________________________   PHYSICAL EXAM:  VITAL SIGNS: ED Triage Vitals  Enc Vitals Group     BP 07/02/17 1506 105/63     Pulse Rate 07/02/17 1507 89     Resp 07/02/17 1514 (!) 24     Temp 07/02/17 1514 99.3 F (37.4 C)     Temp Source 07/02/17 1514 Oral     SpO2 07/02/17 1507 95 %     Weight 07/02/17 1516 120 lb (54.4 kg)     Height 07/02/17 1516 5\' 2"  (1.575 m)     Head Circumference --      Peak Flow --      Pain Score 07/02/17 1516 10     Pain Loc --      Pain Edu? --      Excl. in GC? --  Vital signs reviewed, nursing assessments reviewed.   Constitutional:   Alert and oriented. uncomfortable, not in distress Eyes:   Conjunctivae are normal. EOMI. PERRL. ENT      Head:   Normocephalic and atraumatic.      Nose:   No congestion/rhinnorhea.       Mouth/Throat:   dry mucous membranes, no pharyngeal erythema. No peritonsillar mass.       Neck:   No meningismus. Full ROM. Hematological/Lymphatic/Immunilogical:   No cervical lymphadenopathy. Cardiovascular:   RRR. Symmetric bilateral radial and DP pulses.  No murmurs.  Respiratory:   Normal respiratory effort without tachypnea/retractions. Breath sounds are clear and equal bilaterally. No wheezes/rales/rhonchi. Gastrointestinal:   Soft with generalized tenderness, worse in the right upper quadrant. There is  guarding. Nondistended. There is right-sided CVA tenderness. Genitourinary:   deferred Musculoskeletal:   Normal range of motion in all extremities. No joint effusions.  No lower extremity tenderness.  No edema. Neurologic:   Normal speech and language.  Motor grossly intact. No acute focal neurologic deficits are appreciated.  Skin:    Skin is warm, dry and intact. No rash noted.  No petechiae, purpura, or bullae.  ____________________________________________    LABS (pertinent positives/negatives) (all labs ordered are listed, but only abnormal results are displayed) Labs Reviewed  COMPREHENSIVE METABOLIC PANEL - Abnormal; Notable for the following components:      Result Value   Sodium 129 (*)    Chloride 95 (*)    Glucose, Bld 189 (*)    All other components within normal limits  CBC - Abnormal; Notable for the following components:   WBC 13.8 (*)    All other components within normal limits  LIPASE, BLOOD  URINALYSIS, COMPLETE (UACMP) WITH MICROSCOPIC   ____________________________________________   EKG    ____________________________________________    RADIOLOGY  Ct Abdomen Pelvis W Contrast  Result Date: 07/02/2017 CLINICAL DATA:  Worsening right-sided abdominal pain starting yesterday. Recent diagnosis of C. difficile colitis, improving with antibiotics until yesterday. Nausea. EXAM: CT ABDOMEN AND PELVIS WITH CONTRAST TECHNIQUE: Multidetector CT imaging of the abdomen and pelvis was performed using the standard protocol following bolus administration of intravenous contrast. CONTRAST:  ISOVUE-300 IOPAMIDOL (ISOVUE-300) INJECTION 61% COMPARISON:  Chest radiograph 06/30/2017 FINDINGS: Lower chest: Suspected emphysema. Calcification of the aortic valve. Small type 1 hiatal hernia. Hepatobiliary: Mildly contracted gallbladder. Pancreas: Unremarkable Spleen: Unremarkable Adrenals/Urinary Tract: 3 simple appearing left renal cysts are present. Adrenal glands  unremarkable. Stomach/Bowel: The appendix is distended up to 1.4 cm. There are several internal calcifications on image 38/2 as well as 2 peri appendiceal calcifications and periappendiceal fluid in addition to several tiny locules of peri appendiceal gas, highly suspicious for perforated appendicitis. Notable ascites in the right abdomen tracking in the paracolic gutter, right perihepatic region, porta hepatis, and right paracolic region. There is stool like consistency in the terminal ileum as well as wall thickening in the cecum and ascending colon. There is an unusual transmesenteric internal herniation of transverse colon extending through the mesentery of the splenic flexure/upper descending colon. There is some associated twisting in narrowing of the bowel associated with the herniated portion, but I am skeptical of true obstruction. There is some formed stool in the distal colon as well as distal descending and sigmoid colon diverticulosis without active diverticulitis. Pelvic ascites noted. Vascular/Lymphatic: Aortoiliac atherosclerotic vascular disease. Patent celiac trunk, SMA, and IMA. At the level of the hiatus, a periaortic lymph node measures 0.8 cm in short axis on image  14/2. Reproductive: Uterus absent.  No appreciable adnexal abnormality. Other: Mesenteric and omental edema without appreciable tumor deposition. Musculoskeletal: Endplate sclerosis and degenerative disc disease at L5-S1 with spurring contributing to right foraminal narrowing at this level. IMPRESSION: 1. Acute ruptured appendicitis, with several tiny locules of gas and several spilled appendicoliths in the periappendiceal region, as well as ascites and extensive appendiceal dilatation. Strictly speaking I cannot exclude an appendiceal mucocele that has ruptured. 2. There is a transmesenteric herniation of transverse colon extending through the mesentery of the splenic flexure/upper descending colon. The herniated bowel loop is  twisted but does not appear ischemic at this time. 3. Considerable ascites mainly eccentric to the right side and probably due to the ruptured appendicitis. 4. Other imaging findings of potential clinical significance: Aortic Atherosclerosis (ICD10-I70.0) and Emphysema (ICD10-J43.9). Calcification of the aortic valve. Small type 1 hiatal hernia. Mesenteric and omental edema. Right foraminal impingement at L5-S1. Critical Value/emergent results were called by telephone at the time of interpretation on 07/02/2017 at 4:36 pm to Dr. Sharman Cheek , who verbally acknowledged these results. Electronically Signed   By: Gaylyn Rong M.D.   On: 07/02/2017 16:41    ____________________________________________   PROCEDURES Procedures  ____________________________________________  DIFFERENTIAL DIAGNOSIS   appendicitis, abdominal abscess, bowel perforation, diverticulitis, and colitis, cholecystitis, renal colic  CLINICAL IMPRESSION / ASSESSMENT AND PLAN / ED COURSE  Pertinent labs & imaging results that were available during my care of the patient were reviewed by me and considered in my medical decision making (see chart for details).    patient not in distress, presents with normal vital signs but severe abdominal pain. With her recent treatment for C. difficile with antibiotics and her concerning exam today, she will need a CT scan of the abdomen and pelvis for further evaluation. Check labs including CBC, complete metabolic panel. IV morphine and Zofran and saline for hydration and symptom control.  Clinical Course as of Jul 02 1700  Tue Jul 02, 2017  1630 labs reveal slight hyponatremia and leukocytosis of almost 14,000.   [PS]  1638 D/w rads. CT reveals ruptured appendicitis and internal hernia. Dr. Aleen Campi is in OR. Will start cefepime + flagyl pending surgical evaluation.    [PS]  1645 D/w Dr. Aleen Campi. Agrees with abx, will eval. He is less concerned about a significant internal  hernia.    [PS]  1657 Pt and family updated.  Additional 4mg  morphine iv for ongoing severe pain.    [PS]    Clinical Course User Index [PS] Sharman Cheek, MD     ____________________________________________   FINAL CLINICAL IMPRESSION(S) / ED DIAGNOSES    Final diagnoses:  Ruptured appendicitis  Generalized abdominal pain     ED Discharge Orders    None      Portions of this note were generated with dragon dictation software. Dictation errors may occur despite best attempts at proofreading.    Sharman Cheek, MD 07/02/17 [phone]

## 2017-07-03 LAB — BASIC METABOLIC PANEL
Anion gap: 7 (ref 5–15)
BUN: 15 mg/dL (ref 6–20)
CALCIUM: 8 mg/dL — AB (ref 8.9–10.3)
CO2: 22 mmol/L (ref 22–32)
Chloride: 101 mmol/L (ref 101–111)
Creatinine, Ser: 0.82 mg/dL (ref 0.44–1.00)
GFR calc Af Amer: >60 mL/min (ref >60)
GLUCOSE: 84 mg/dL (ref 65–99)
Potassium: 3.7 mmol/L (ref 3.5–5.1)
Sodium: 130 mmol/L — ABNORMAL LOW (ref 135–145)

## 2017-07-03 LAB — MAGNESIUM: Magnesium: 1.7 mg/dL (ref 1.7–2.4)

## 2017-07-03 LAB — CBC WITH DIFFERENTIAL/PLATELET
BASOS PCT: 0 %
Basophils Absolute: 0 10*3/uL (ref 0–0.1)
EOS ABS: 0 10*3/uL (ref 0–0.7)
Eosinophils Relative: 0 %
HCT: 33.6 % — ABNORMAL LOW (ref 35.0–47.0)
HEMOGLOBIN: 11.4 g/dL — AB (ref 12.0–16.0)
LYMPHS ABS: 0.8 10*3/uL — AB (ref 1.0–3.6)
Lymphocytes Relative: 15 %
MCH: 30.3 pg (ref 26.0–34.0)
MCHC: 33.9 g/dL (ref 32.0–36.0)
MCV: 89.3 fL (ref 80.0–100.0)
MONO ABS: 0.3 10*3/uL (ref 0.2–0.9)
MONOS PCT: 5 %
NEUTROS PCT: 80 %
Neutro Abs: 4.5 10*3/uL (ref 1.4–6.5)
Platelets: 229 10*3/uL (ref 150–440)
RBC: 3.76 MIL/uL — ABNORMAL LOW (ref 3.80–5.20)
RDW: 13.3 % (ref 11.5–14.5)
WBC: 5.6 10*3/uL (ref 3.6–11.0)

## 2017-07-03 MED ORDER — SODIUM CHLORIDE 0.9 % IV BOLUS
500.0000 mL | Freq: Once | INTRAVENOUS | Status: AC
  Administered 2017-07-03: 500 mL via INTRAVENOUS

## 2017-07-03 MED ORDER — ACETAMINOPHEN 325 MG PO TABS
650.0000 mg | ORAL_TABLET | Freq: Four times a day (QID) | ORAL | Status: DC
  Administered 2017-07-03 – 2017-07-08 (×8): 650 mg via ORAL
  Filled 2017-07-03 (×14): qty 2

## 2017-07-03 NOTE — Progress Notes (Signed)
07/03/2017  Subjective: Patient reports her pain is doing better this morning compared to yesterday.  She reports that she has not voided as much overnight or yesterday.    Vital signs: Temp:  [98.5 F (36.9 C)-100.5 F (38.1 C)] 98.5 F (36.9 C) (04/03 0513) Pulse Rate:  [87-115] 87 (04/03 1127) Resp:  [20-24] 20 (04/03 0513) BP: (88-148)/(53-67) 89/57 (04/03 1127) SpO2:  [92 %-97 %] 93 % (04/03 0513) Weight:  [120 lb (54.4 kg)-124 lb 1.6 oz (56.3 kg)] 124 lb 1.6 oz (56.3 kg) (04/02 2020)   Intake/Output: 04/02 0701 - 04/03 0700 In: 2187 [I.V.:1087; IV Piggyback:1100] Out: 200 [Urine:200] Last BM Date: 07/02/17  Physical Exam: Constitutional: No acute distress Abdomen:  Soft, nondistended, with improved tenderness, though still tender in the right side of abdomen.  Labs:  Recent Labs    07/02/17 1518 07/03/17 0251  WBC 13.8* 5.6  HGB 12.8 11.4*  HCT 38.8 33.6*  PLT 335 229   Recent Labs    07/02/17 1518 07/03/17 0251  NA 129* 130*  K 3.8 3.7  CL 95* 101  CO2 26 22  GLUCOSE 189* 84  BUN 10 15  CREATININE 0.81 0.82  CALCIUM 9.0 8.0*   No results for input(s): LABPROT, INR in the last 72 hours.  Imaging: Ct Abdomen Pelvis W Contrast  Result Date: 07/02/2017 CLINICAL DATA:  Worsening right-sided abdominal pain starting yesterday. Recent diagnosis of C. difficile colitis, improving with antibiotics until yesterday. Nausea. EXAM: CT ABDOMEN AND PELVIS WITH CONTRAST TECHNIQUE: Multidetector CT imaging of the abdomen and pelvis was performed using the standard protocol following bolus administration of intravenous contrast. CONTRAST:  ISOVUE-300 IOPAMIDOL (ISOVUE-300) INJECTION 61% COMPARISON:  Chest radiograph 06/30/2017 FINDINGS: Lower chest: Suspected emphysema. Calcification of the aortic valve. Small type 1 hiatal hernia. Hepatobiliary: Mildly contracted gallbladder. Pancreas: Unremarkable Spleen: Unremarkable Adrenals/Urinary Tract: 3 simple appearing left  renal cysts are present. Adrenal glands unremarkable. Stomach/Bowel: The appendix is distended up to 1.4 cm. There are several internal calcifications on image 38/2 as well as 2 peri appendiceal calcifications and periappendiceal fluid in addition to several tiny locules of peri appendiceal gas, highly suspicious for perforated appendicitis. Notable ascites in the right abdomen tracking in the paracolic gutter, right perihepatic region, porta hepatis, and right paracolic region. There is stool like consistency in the terminal ileum as well as wall thickening in the cecum and ascending colon. There is an unusual transmesenteric internal herniation of transverse colon extending through the mesentery of the splenic flexure/upper descending colon. There is some associated twisting in narrowing of the bowel associated with the herniated portion, but I am skeptical of true obstruction. There is some formed stool in the distal colon as well as distal descending and sigmoid colon diverticulosis without active diverticulitis. Pelvic ascites noted. Vascular/Lymphatic: Aortoiliac atherosclerotic vascular disease. Patent celiac trunk, SMA, and IMA. At the level of the hiatus, a periaortic lymph node measures 0.8 cm in short axis on image 14/2. Reproductive: Uterus absent.  No appreciable adnexal abnormality. Other: Mesenteric and omental edema without appreciable tumor deposition. Musculoskeletal: Endplate sclerosis and degenerative disc disease at L5-S1 with spurring contributing to right foraminal narrowing at this level. IMPRESSION: 1. Acute ruptured appendicitis, with several tiny locules of gas and several spilled appendicoliths in the periappendiceal region, as well as ascites and extensive appendiceal dilatation. Strictly speaking I cannot exclude an appendiceal mucocele that has ruptured. 2. There is a transmesenteric herniation of transverse colon extending through the mesentery of the splenic flexure/upper  descending  colon. The herniated bowel loop is twisted but does not appear ischemic at this time. 3. Considerable ascites mainly eccentric to the right side and probably due to the ruptured appendicitis. 4. Other imaging findings of potential clinical significance: Aortic Atherosclerosis (ICD10-I70.0) and Emphysema (ICD10-J43.9). Calcification of the aortic valve. Small type 1 hiatal hernia. Mesenteric and omental edema. Right foraminal impingement at L5-S1. Critical Value/emergent results were called by telephone at the time of interpretation on 07/02/2017 at 4:36 pm to Dr. Sharman CheekPHILLIP STAFFORD , who verbally acknowledged these results. Electronically Signed   By: Gaylyn RongWalter  Liebkemann M.D.   On: 07/02/2017 16:41    Assessment/Plan: 67 yo female with c-diff colitis and perforated appendicitis  --continue NPO with IV fluid hydration. She has had soft blood pressure and the patient has reported feeling weak.  Bolused 500 ml earlier this morning and will bolus again 500 ml to complete a liter. --continue antibiotic regimen.  ID consult pending today.  WBC improved and patient is not worsened clinically. --discussed with patient that pending on her progress, we may or may not repeat CT scan in two or so days to reassess her abdomen. --DVT and GI prophylaxis --OOB, ambulate.  Encourage incentive spirometer.     Howie IllJose Luis Islah Eve, MD [facility]

## 2017-07-03 NOTE — Progress Notes (Signed)
Notified MD of low BP following bolus. Pt not symptomatic, MD to come see patient after leaving OR.

## 2017-07-03 NOTE — Consult Note (Signed)
[facility] Infectious Disease     Reason for Consult: Ruptured appendicitis    Referring Physician: Olean Ree Date of Admission:  07/02/2017   Active Problems:   Acute perforated appendicitis   Enteritis due to Clostridium difficile   HPI: Donna Hammond is a 67 y.o. female admitted with abd pain and found to have ruptured appendicitis. She had received abx in Feb x 2 courses for sinus sxs. Then developed diarrhea for 3 weeks and was seen last week and had + C diff so started oral vanco. However developed progressive abd pain and seen in ED. Has had CT scan and has seen surgery. Started on cefepime, IV flagyl and vanco. Feels significantly improved but still with pain. Diarrhea improving. WBC down 13 to 5  Past Medical History:  Diagnosis Date  . Barrett esophagus   . Clostridium difficile diarrhea 06/24/2017  . Diverticulosis   . Emphysema lung ([place])   . Hypertension    Past Surgical History:  Procedure Laterality Date  . ABDOMINAL HYSTERECTOMY     Social History   Tobacco Use  . Smoking status: Current Every Day Smoker    Packs/day: 0.50    Types: Cigarettes  . Smokeless tobacco: Never Used  Substance Use Topics  . Alcohol use: Never    Frequency: Never  . Drug use: Never   No family history on file.  Allergies:  Allergies  Allergen Reactions  . Levaquin [Levofloxacin In D5w] Swelling    tingling    Current antibiotics: Antibiotics Given (last 72 hours)    Date/Time Action Medication Dose Rate   07/02/17 1659 New Bag/Given   ceFEPIme (MAXIPIME) 2 g in sodium chloride 0.9 % 100 mL IVPB 2 g 200 mL/hr   07/02/17 1731 New Bag/Given   metroNIDAZOLE (FLAGYL) IVPB 500 mg 500 mg 100 mL/hr   07/02/17 2146 New Bag/Given   piperacillin-tazobactam (ZOSYN) IVPB 3.375 g 3.375 g 12.5 mL/hr   07/02/17 2147 Given   metroNIDAZOLE (FLAGYL) tablet 500 mg 500 mg    07/02/17 2242 Given   vancomycin (VANCOCIN) 50 mg/mL oral solution 125 mg 125 mg    07/03/17 0515 New  Bag/Given   piperacillin-tazobactam (ZOSYN) IVPB 3.375 g 3.375 g 12.5 mL/hr   07/03/17 0610 Given   metroNIDAZOLE (FLAGYL) tablet 500 mg 500 mg    07/03/17 1028 Given   vancomycin (VANCOCIN) 50 mg/mL oral solution 125 mg 125 mg    07/03/17 1417 Given   vancomycin (VANCOCIN) 50 mg/mL oral solution 125 mg 125 mg    07/03/17 1417 Given   metroNIDAZOLE (FLAGYL) tablet 500 mg 500 mg       MEDICATIONS: . acetaminophen  1,000 mg Oral Q6H  . amLODipine  10 mg Oral Daily  . enoxaparin (LOVENOX) injection  40 mg Subcutaneous Q24H  . ketorolac  30 mg Intravenous Q6H  . lisinopril  20 mg Oral Daily  . metroNIDAZOLE  500 mg Oral Q8H  . pantoprazole (PROTONIX) IV  40 mg Intravenous QHS  . vancomycin  125 mg Oral QID    Review of Systems - 11 systems reviewed and negative per HPI   OBJECTIVE: Temp:  [97.8 F (36.6 C)-100.5 F (38.1 C)] 97.8 F (36.6 C) (04/03 1311) Pulse Rate:  [84-115] 90 (04/03 1354) Resp:  [16-24] 16 (04/03 1311) BP: (84-148)/(52-67) 98/57 (04/03 1354) SpO2:  [92 %-97 %] 97 % (04/03 1311) Weight:  [54.4 kg (120 lb)-56.3 kg (124 lb 1.6 oz)] 56.3 kg (124 lb 1.6 oz) (04/02 2020) Physical Exam  Constitutional: He is oriented to person, place, and time. He appears well-developed and well-nourished. No distress.  HENT:  Mouth/Throat: Oropharynx is clear and moist. No oropharyngeal exudate.  Cardiovascular: Normal rate, regular rhythm and normal heart sounds. Exam reveals no gallop and no friction rub.  No murmur heard.  Pulmonary/Chest: Effort normal and breath sounds normal. No respiratory distress. He has no wheezes.  Abdominal: Soft. Tender esp on R mid quadrant, + guarding, Lymphadenopathy:  He has no cervical adenopathy.  Neurological: He is alert and oriented to person, place, and time.  Skin: Skin is warm and dry. No rash noted. No erythema.  Psychiatric: He has a normal mood and affect. His behavior is normal.    LABS: Results for orders placed or performed  during the hospital encounter of 07/02/17 (from the past 48 hour(s))  Lipase, blood     Status: None   Collection Time: 07/02/17  3:18 PM  Result Value Ref Range   Lipase 20 11 - 51 U/L    Comment: Performed at [facility], [address] [place], [place] [address]  Comprehensive metabolic panel     Status: Abnormal   Collection Time: 07/02/17  3:18 PM  Result Value Ref Range   Sodium 129 (L) 135 - 145 mmol/L   Potassium 3.8 3.5 - 5.1 mmol/L   Chloride 95 (L) 101 - 111 mmol/L   CO2 26 22 - 32 mmol/L   Glucose, Bld 189 (H) 65 - 99 mg/dL   BUN 10 6 - 20 mg/dL   Creatinine, Ser 0.81 0.44 - 1.00 mg/dL   Calcium 9.0 8.9 - 10.3 mg/dL   Total Protein 6.9 6.5 - 8.1 g/dL   Albumin 4.1 3.5 - 5.0 g/dL   AST 34 15 - 41 U/L   ALT 17 14 - 54 U/L   Alkaline Phosphatase 54 38 - 126 U/L   Total Bilirubin 0.8 0.3 - 1.2 mg/dL   GFR calc non Af Amer >60 >60 mL/min   GFR calc Af Amer >60 >60 mL/min    Comment: (NOTE) The eGFR has been calculated using the CKD EPI equation. This calculation has not been validated in all clinical situations. eGFR's persistently <60 mL/min signify possible Chronic Kidney Disease.    Anion gap 8 5 - 15    Comment: Performed at [facility], [address] [place], [place] [address]  CBC     Status: Abnormal   Collection Time: 07/02/17  3:18 PM  Result Value Ref Range   WBC 13.8 (H) 3.6 - 11.0 K/uL   RBC 4.30 3.80 - 5.20 MIL/uL   Hemoglobin 12.8 12.0 - 16.0 g/dL   HCT 38.8 35.0 - 47.0 %   MCV 90.1 80.0 - 100.0 fL   MCH 29.6 26.0 - 34.0 pg   MCHC 32.9 32.0 - 36.0 g/dL   RDW 13.2 11.5 - 14.5 %   Platelets 335 150 - 440 K/uL    Comment: Performed at [facility], [address], [place], [place] [address]  Basic metabolic panel     Status: Abnormal   Collection Time: 07/03/17  2:51 AM  Result Value Ref Range   Sodium 130 (L) 135 - 145 mmol/L   Potassium 3.7 3.5 - 5.1 mmol/L   Chloride 101 101 - 111 mmol/L   CO2 22 22 - 32  mmol/L   Glucose, Bld 84 65 - 99 mg/dL   BUN 15 6 - 20 mg/dL   Creatinine, Ser 0.82 0.44 -  1.00 mg/dL   Calcium 8.0 (L) 8.9 - 10.3 mg/dL   GFR calc non Af Amer >60 >60 mL/min   GFR calc Af Amer >60 >60 mL/min    Comment: (NOTE) The eGFR has been calculated using the CKD EPI equation. This calculation has not been validated in all clinical situations. eGFR's persistently <60 mL/min signify possible Chronic Kidney Disease.    Anion gap 7 5 - 15    Comment: Performed at [facility], [address] [place], [place] [address]  Magnesium     Status: None   Collection Time: 07/03/17  2:51 AM  Result Value Ref Range   Magnesium 1.7 1.7 - 2.4 mg/dL    Comment: Performed at [facility], [address] [place], [place] [address]  CBC WITH DIFFERENTIAL     Status: Abnormal   Collection Time: 07/03/17  2:51 AM  Result Value Ref Range   WBC 5.6 3.6 - 11.0 K/uL   RBC 3.76 (L) 3.80 - 5.20 MIL/uL   Hemoglobin 11.4 (L) 12.0 - 16.0 g/dL   HCT 33.6 (L) 35.0 - 47.0 %   MCV 89.3 80.0 - 100.0 fL   MCH 30.3 26.0 - 34.0 pg   MCHC 33.9 32.0 - 36.0 g/dL   RDW 13.3 11.5 - 14.5 %   Platelets 229 150 - 440 K/uL   Neutrophils Relative % 80 %   Neutro Abs 4.5 1.4 - 6.5 K/uL   Lymphocytes Relative 15 %   Lymphs Abs 0.8 (L) 1.0 - 3.6 K/uL   Monocytes Relative 5 %   Monocytes Absolute 0.3 0.2 - 0.9 K/uL   Eosinophils Relative 0 %   Eosinophils Absolute 0.0 0 - 0.7 K/uL   Basophils Relative 0 %   Basophils Absolute 0.0 0 - 0.1 K/uL    Comment: Performed at [facility], [address] [place], [place] [address]   No components found for: ESR, C REACTIVE PROTEIN MICRO: Recent Results (from the past 720 hour(s))  C difficile quick scan w PCR reflex     Status: Abnormal   Collection Time: 06/25/17  9:40 AM  Result Value Ref Range Status   C Diff antigen POSITIVE (A) NEGATIVE Final    Comment: CRITICAL RESULT CALLED TO, READ BACK BY AND VERIFIED WITH: CRYSTAL REEVES  @1302  06/25/17 AKT    C Diff toxin POSITIVE (A) NEGATIVE Final   C Diff interpretation Toxin producing C. difficile detected.  Final    Comment: Performed at [facility], [address] [place],  [address]  Gastrointestinal Panel by PCR , Stool     Status: None   Collection Time: 06/25/17  9:40 AM  Result Value Ref Range Status   Campylobacter species NOT DETECTED NOT DETECTED Final   Plesimonas shigelloides NOT DETECTED NOT DETECTED Final   Salmonella species NOT DETECTED NOT DETECTED Final   Yersinia enterocolitica NOT DETECTED NOT DETECTED Final   Vibrio species NOT DETECTED NOT DETECTED Final   Vibrio cholerae NOT DETECTED NOT DETECTED Final   Enteroaggregative E coli (EAEC) NOT DETECTED NOT DETECTED Final   Enteropathogenic E coli (EPEC) NOT DETECTED NOT DETECTED Final   Enterotoxigenic E coli (ETEC) NOT DETECTED NOT DETECTED Final   Shiga like toxin producing E coli (STEC) NOT DETECTED NOT DETECTED Final   Shigella/Enteroinvasive E coli (EIEC) NOT DETECTED NOT DETECTED Final   Cryptosporidium NOT DETECTED NOT DETECTED Final   Cyclospora cayetanensis NOT DETECTED NOT DETECTED Final   Entamoeba histolytica NOT DETECTED  NOT DETECTED Final   Giardia lamblia NOT DETECTED NOT DETECTED Final   Adenovirus F40/41 NOT DETECTED NOT DETECTED Final   Astrovirus NOT DETECTED NOT DETECTED Final   Norovirus GI/GII NOT DETECTED NOT DETECTED Final   Rotavirus A NOT DETECTED NOT DETECTED Final   Sapovirus (I, II, IV, and V) NOT DETECTED NOT DETECTED Final    Comment: Performed at [facility], [address] [place], [place] [address]    IMAGING: Dg Chest 2 View  Result Date: 06/30/2017 CLINICAL DATA:  Epigastric chest pain since this evening radiating to back. EXAM: CHEST - 2 VIEW COMPARISON:  None. FINDINGS: Cardiomediastinal silhouette is normal. No pleural effusions or focal consolidations. Mild hyperinflation and chronic interstitial changes. Trachea projects  midline and there is no pneumothorax. Soft tissue planes and included osseous structures are non-suspicious. IMPRESSION: .  No focal consolidation.  COPD Electronically Signed   By: Elon Alas M.D.   On: 06/30/2017 23:37   Ct Abdomen Pelvis W Contrast  Result Date: 07/02/2017 CLINICAL DATA:  Worsening right-sided abdominal pain starting yesterday. Recent diagnosis of C. difficile colitis, improving with antibiotics until yesterday. Nausea. EXAM: CT ABDOMEN AND PELVIS WITH CONTRAST TECHNIQUE: Multidetector CT imaging of the abdomen and pelvis was performed using the standard protocol following bolus administration of intravenous contrast. CONTRAST:  163m ISOVUE-300 IOPAMIDOL (ISOVUE-300) INJECTION 61% COMPARISON:  Chest radiograph 06/30/2017 FINDINGS: Lower chest: Suspected emphysema. Calcification of the aortic valve. Small type 1 hiatal hernia. Hepatobiliary: Mildly contracted gallbladder. Pancreas: Unremarkable Spleen: Unremarkable Adrenals/Urinary Tract: 3 simple appearing left renal cysts are present. Adrenal glands unremarkable. Stomach/Bowel: The appendix is distended up to 1.4 cm. There are several internal calcifications on image 38/2 as well as 2 peri appendiceal calcifications and periappendiceal fluid in addition to several tiny locules of peri appendiceal gas, highly suspicious for perforated appendicitis. Notable ascites in the right abdomen tracking in the paracolic gutter, right perihepatic region, porta hepatis, and right paracolic region. There is stool like consistency in the terminal ileum as well as wall thickening in the cecum and ascending colon. There is an unusual transmesenteric internal herniation of transverse colon extending through the mesentery of the splenic flexure/upper descending colon. There is some associated twisting in narrowing of the bowel associated with the herniated portion, but I am skeptical of true obstruction. There is some formed stool in the distal colon as  well as distal descending and sigmoid colon diverticulosis without active diverticulitis. Pelvic ascites noted. Vascular/Lymphatic: Aortoiliac atherosclerotic vascular disease. Patent celiac trunk, SMA, and IMA. At the level of the hiatus, a periaortic lymph node measures 0.8 cm in short axis on image 14/2. Reproductive: Uterus absent.  No appreciable adnexal abnormality. Other: Mesenteric and omental edema without appreciable tumor deposition. Musculoskeletal: Endplate sclerosis and degenerative disc disease at L5-S1 with spurring contributing to right foraminal narrowing at this level. IMPRESSION: 1. Acute ruptured appendicitis, with several tiny locules of gas and several spilled appendicoliths in the periappendiceal region, as well as ascites and extensive appendiceal dilatation. Strictly speaking I cannot exclude an appendiceal mucocele that has ruptured. 2. There is a transmesenteric herniation of transverse colon extending through the mesentery of the splenic flexure/upper descending colon. The herniated bowel loop is twisted but does not appear ischemic at this time. 3. Considerable ascites mainly eccentric to the right side and probably due to the ruptured appendicitis. 4. Other imaging findings of potential clinical significance: Aortic Atherosclerosis (ICD10-I70.0) and Emphysema (ICD10-J43.9). Calcification of the aortic valve. Small type 1 hiatal hernia.  Mesenteric and omental edema. Right foraminal impingement at L5-S1. Critical Value/emergent results were called by telephone at the time of interpretation on 07/02/2017 at 4:36 pm to Dr. Carrie Mew , who verbally acknowledged these results. Electronically Signed   By: Van Clines M.D.   On: 07/02/2017 16:41    Assessment:   Donna Hammond is a 67 y.o. female admitted with ruptured appendicitis following a recent dx of C diff being treated as otpt with vancomycin. Since admit stools are now firmer, wbc down and less pain. Conservative  surgical approach at this point.   Recommendations Appendicitis- cont cefepime and IV flagyl. Follow clinical exam  C diff - cont oral vanco. The IV metro covers as well. Will need 10 days more of oral vanco when she finishes her abx course for the appendicitis.  Thank you very much for allowing me to participate in the care of this patient. Please call with questions.   Cheral Marker. Ola Spurr, MD

## 2017-07-03 NOTE — Progress Notes (Signed)
Notified MD of BP 88/57 hold BP meds at this time. Per MD place order for NS boluso of at 999.

## 2017-07-04 LAB — CBC WITH DIFFERENTIAL/PLATELET
BASOS ABS: 0 10*3/uL (ref 0–0.1)
Basophils Relative: 0 %
EOS PCT: 0 %
Eosinophils Absolute: 0 10*3/uL (ref 0–0.7)
HEMATOCRIT: 31.1 % — AB (ref 35.0–47.0)
Hemoglobin: 10.5 g/dL — ABNORMAL LOW (ref 12.0–16.0)
LYMPHS PCT: 7 %
Lymphs Abs: 0.7 10*3/uL — ABNORMAL LOW (ref 1.0–3.6)
MCH: 30.4 pg (ref 26.0–34.0)
MCHC: 33.8 g/dL (ref 32.0–36.0)
MCV: 89.9 fL (ref 80.0–100.0)
Monocytes Absolute: 0.4 10*3/uL (ref 0.2–0.9)
Monocytes Relative: 4 %
NEUTROS ABS: 8.5 10*3/uL — AB (ref 1.4–6.5)
Neutrophils Relative %: 89 %
PLATELETS: 210 10*3/uL (ref 150–440)
RBC: 3.46 MIL/uL — ABNORMAL LOW (ref 3.80–5.20)
RDW: 13.2 % (ref 11.5–14.5)
WBC: 9.6 10*3/uL (ref 3.6–11.0)

## 2017-07-04 LAB — BASIC METABOLIC PANEL
ANION GAP: 8 (ref 5–15)
BUN: 13 mg/dL (ref 6–20)
CO2: 22 mmol/L (ref 22–32)
Calcium: 8 mg/dL — ABNORMAL LOW (ref 8.9–10.3)
Chloride: 103 mmol/L (ref 101–111)
Creatinine, Ser: 0.63 mg/dL (ref 0.44–1.00)
GFR calc Af Amer: >60 mL/min (ref >60)
GLUCOSE: 60 mg/dL — AB (ref 65–99)
POTASSIUM: 3.1 mmol/L — AB (ref 3.5–5.1)
Sodium: 133 mmol/L — ABNORMAL LOW (ref 135–145)

## 2017-07-04 LAB — MAGNESIUM: Magnesium: 1.8 mg/dL (ref 1.7–2.4)

## 2017-07-04 MED ORDER — KCL IN DEXTROSE-NACL 40-5-0.45 MEQ/L-%-% IV SOLN
INTRAVENOUS | Status: DC
  Administered 2017-07-04 – 2017-07-08 (×8): via INTRAVENOUS
  Filled 2017-07-04 (×10): qty 1000

## 2017-07-04 MED ORDER — MAGNESIUM SULFATE 2 GM/50ML IV SOLN
2.0000 g | Freq: Once | INTRAVENOUS | Status: AC
  Administered 2017-07-04: 2 g via INTRAVENOUS
  Filled 2017-07-04: qty 50

## 2017-07-04 NOTE — Progress Notes (Signed)
07/04/2017  Subjective: No acute events.  Pain slowly improving.  Remains afebrile and with normal WBC.  Had been with borderline low blood pressure, improved with fluid boluses.  Have held her BP meds.  Vital signs: Temp:  [97.8 F (36.6 C)-98.4 F (36.9 C)] 97.8 F (36.6 C) (04/04 0331) Pulse Rate:  [84-99] 99 (04/04 0331) Resp:  [16-22] 22 (04/04 0331) BP: (84-117)/(39-61) 97/39 (04/04 1018) SpO2:  [91 %-97 %] 91 % (04/04 0331)   Intake/Output: 04/03 0701 - 04/04 0700 In: 3275 [I.V.:2137; IV Piggyback:1138] Out: 0  Last BM Date: 07/03/17  Physical Exam: Constitutional: No acute distress Abdomen:  Soft, nondistended, with tenderness to palpation over right abdomen.  Labs:  Recent Labs    07/03/17 0251 07/04/17 0411  WBC 5.6 9.6  HGB 11.4* 10.5*  HCT 33.6* 31.1*  PLT 229 210   Recent Labs    07/03/17 0251 07/04/17 0411  NA 130* 133*  K 3.7 3.1*  CL 101 103  CO2 22 22  GLUCOSE 84 60*  BUN 15 13  CREATININE 0.82 0.63  CALCIUM 8.0* 8.0*   No results for input(s): LABPROT, INR in the last 72 hours.  Imaging: No results found.  Assessment/Plan: 67 yo female with cdiff colitis and ruptured appendicitis  --will start clear liquid diet today.  Encouraged patient to take things slowly and not overdo and to stop if her pain worsens after clears. --if no improvement in her pain tomorrow, would repeat CT scan of abdomen to evaluate for possible abscess or other complication.   Howie IllJose Luis Nakeisha Greenhouse, MD [facility]

## 2017-07-04 NOTE — Progress Notes (Signed)
[facility] CLINIC INFECTIOUS DISEASE PROGRESS NOTE Date of Admission:  07/02/2017     ID: Ula Couvillon is a 67 y.o. female with ruptured appendicitis  Active Problems:   Acute perforated appendicitis   Enteritis due to Clostridium difficile   Subjective: A little less pain, 2/10. Bowels are loose again, no nv  ROS  Eleven systems are reviewed and negative except per hpi  Medications:  Antibiotics Given (last 72 hours)    Date/Time Action Medication Dose Rate   07/02/17 1659 New Bag/Given   ceFEPIme (MAXIPIME) 2 g in sodium chloride 0.9 % 100 mL IVPB 2 g 200 mL/hr   07/02/17 1731 New Bag/Given   metroNIDAZOLE (FLAGYL) IVPB 500 mg 500 mg 100 mL/hr   07/02/17 2146 New Bag/Given   piperacillin-tazobactam (ZOSYN) IVPB 3.375 g 3.375 g 12.5 mL/hr   07/02/17 2147 Given   metroNIDAZOLE (FLAGYL) tablet 500 mg 500 mg    07/02/17 2242 Given   vancomycin (VANCOCIN) 50 mg/mL oral solution 125 mg 125 mg    07/03/17 0515 New Bag/Given   piperacillin-tazobactam (ZOSYN) IVPB 3.375 g 3.375 g 12.5 mL/hr   07/03/17 0610 Given   metroNIDAZOLE (FLAGYL) tablet 500 mg 500 mg    07/03/17 1028 Given   vancomycin (VANCOCIN) 50 mg/mL oral solution 125 mg 125 mg    07/03/17 1417 Given   vancomycin (VANCOCIN) 50 mg/mL oral solution 125 mg 125 mg    07/03/17 1417 Given   metroNIDAZOLE (FLAGYL) tablet 500 mg 500 mg    07/03/17 1534 New Bag/Given  [was not available in pixes. Greyed out]   piperacillin-tazobactam (ZOSYN) IVPB 3.375 g 3.375 g 12.5 mL/hr   07/03/17 1717 Given   vancomycin (VANCOCIN) 50 mg/mL oral solution 125 mg 125 mg    07/03/17 2203 Given   vancomycin (VANCOCIN) 50 mg/mL oral solution 125 mg 125 mg    07/03/17 2203 New Bag/Given   piperacillin-tazobactam (ZOSYN) IVPB 3.375 g 3.375 g 12.5 mL/hr   07/03/17 2203 Given   metroNIDAZOLE (FLAGYL) tablet 500 mg 500 mg    07/04/17 0535 New Bag/Given   piperacillin-tazobactam (ZOSYN) IVPB 3.375 g 3.375 g 12.5 mL/hr   07/04/17 0535 Given    metroNIDAZOLE (FLAGYL) tablet 500 mg 500 mg    07/04/17 1018 Given   vancomycin (VANCOCIN) 50 mg/mL oral solution 125 mg 125 mg    07/04/17 1315 Given   vancomycin (VANCOCIN) 50 mg/mL oral solution 125 mg 125 mg    07/04/17 1315 New Bag/Given   piperacillin-tazobactam (ZOSYN) IVPB 3.375 g 3.375 g 12.5 mL/hr   07/04/17 1315 Given   metroNIDAZOLE (FLAGYL) tablet 500 mg 500 mg      . acetaminophen  650 mg Oral Q6H  . enoxaparin (LOVENOX) injection  40 mg Subcutaneous Q24H  . ketorolac  30 mg Intravenous Q6H  . metroNIDAZOLE  500 mg Oral Q8H  . pantoprazole (PROTONIX) IV  40 mg Intravenous QHS  . vancomycin  125 mg Oral QID    Objective: Vital signs in last 24 hours: Temp:  [97.7 F (36.5 C)-98.4 F (36.9 C)] 97.7 F (36.5 C) (04/04 1441) Pulse Rate:  [85-102] 102 (04/04 1441) Resp:  [18-22] 22 (04/04 0331) BP: (97-117)/(39-61) 111/61 (04/04 1441) SpO2:  [91 %-97 %] 97 % (04/04 1441) Constitutional: He is oriented to person, place, and time. He appears well-developed and well-nourished. No distress.  HENT:  Mouth/Throat: Oropharynx is clear and moist. No oropharyngeal exudate.  Cardiovascular: Normal rate, regular rhythm and normal heart sounds. Exam reveals no gallop  and no friction rub.  No murmur heard.  Pulmonary/Chest: Effort normal and breath sounds normal. No respiratory distress. He has no wheezes.  Abdominal: Soft. Tender esp on R mid quadrant, less  guarding, Lymphadenopathy:  He has no cervical adenopathy.  Neurological: He is alert and oriented to person, place, and time.     Lab Results Recent Labs    07/03/17 0251 07/04/17 0411  WBC 5.6 9.6  HGB 11.4* 10.5*  HCT 33.6* 31.1*  NA 130* 133*  K 3.7 3.1*  CL 101 103  CO2 22 22  BUN 15 13  CREATININE 0.82 0.63    Microbiology: @micro @ Studies/Results: Ct Abdomen Pelvis W Contrast  Result Date: 07/02/2017 CLINICAL DATA:  Worsening right-sided abdominal pain starting yesterday. Recent diagnosis of C.  difficile colitis, improving with antibiotics until yesterday. Nausea. EXAM: CT ABDOMEN AND PELVIS WITH CONTRAST TECHNIQUE: Multidetector CT imaging of the abdomen and pelvis was performed using the standard protocol following bolus administration of intravenous contrast. CONTRAST:  ISOVUE-300 IOPAMIDOL (ISOVUE-300) INJECTION 61% COMPARISON:  Chest radiograph 06/30/2017 FINDINGS: Lower chest: Suspected emphysema. Calcification of the aortic valve. Small type 1 hiatal hernia. Hepatobiliary: Mildly contracted gallbladder. Pancreas: Unremarkable Spleen: Unremarkable Adrenals/Urinary Tract: 3 simple appearing left renal cysts are present. Adrenal glands unremarkable. Stomach/Bowel: The appendix is distended up to 1.4 cm. There are several internal calcifications on image 38/2 as well as 2 peri appendiceal calcifications and periappendiceal fluid in addition to several tiny locules of peri appendiceal gas, highly suspicious for perforated appendicitis. Notable ascites in the right abdomen tracking in the paracolic gutter, right perihepatic region, porta hepatis, and right paracolic region. There is stool like consistency in the terminal ileum as well as wall thickening in the cecum and ascending colon. There is an unusual transmesenteric internal herniation of transverse colon extending through the mesentery of the splenic flexure/upper descending colon. There is some associated twisting in narrowing of the bowel associated with the herniated portion, but I am skeptical of true obstruction. There is some formed stool in the distal colon as well as distal descending and sigmoid colon diverticulosis without active diverticulitis. Pelvic ascites noted. Vascular/Lymphatic: Aortoiliac atherosclerotic vascular disease. Patent celiac trunk, SMA, and IMA. At the level of the hiatus, a periaortic lymph node measures 0.8 cm in short axis on image 14/2. Reproductive: Uterus absent.  No appreciable adnexal abnormality. Other:  Mesenteric and omental edema without appreciable tumor deposition. Musculoskeletal: Endplate sclerosis and degenerative disc disease at L5-S1 with spurring contributing to right foraminal narrowing at this level. IMPRESSION: 1. Acute ruptured appendicitis, with several tiny locules of gas and several spilled appendicoliths in the periappendiceal region, as well as ascites and extensive appendiceal dilatation. Strictly speaking I cannot exclude an appendiceal mucocele that has ruptured. 2. There is a transmesenteric herniation of transverse colon extending through the mesentery of the splenic flexure/upper descending colon. The herniated bowel loop is twisted but does not appear ischemic at this time. 3. Considerable ascites mainly eccentric to the right side and probably due to the ruptured appendicitis. 4. Other imaging findings of potential clinical significance: Aortic Atherosclerosis (ICD10-I70.0) and Emphysema (ICD10-J43.9). Calcification of the aortic valve. Small type 1 hiatal hernia. Mesenteric and omental edema. Right foraminal impingement at L5-S1. Critical Value/emergent results were called by telephone at the time of interpretation on 07/02/2017 at 4:36 pm to Dr. Sharman Cheek , who verbally acknowledged these results. Electronically Signed   By: Gaylyn Rong M.D.   On: 07/02/2017 16:41    Assessment/Plan: Helmut Muster  Claud KelpGiardino is a 67 y.o. female admitted with ruptured appendicitis following a recent dx of C diff being treated as otpt with vancomycin. Since admit stools are now firmer, wbc down and less pain. Conservative surgical approach at this point.  4/4 no fevers, wbc 9, pain a little better Recommendations Appendicitis- cont cefepime and IV flagyl. Follow clinical exam  C diff - cont oral vanco. The IV metro covers as well. Will need 10 days more of oral vanco when she finishes her abx course for the appendicitis.    Thank you very much for the consult. Will follow with  you.  Mick SellDavid P Rexanne Inocencio   07/04/2017, 3:40 PM

## 2017-07-04 NOTE — Progress Notes (Signed)
Spoke with Dr. Aleen CampiPiscoya about patients complaint of bloating to abdominal area. Patient stated " I feel like there is swelling to my mid area and it's normally not like this and its affecting my breathing". Patient able to carry on full sentences, sats within normal range and patient able to ambulate around room. Patient has had bowel movements today. Per Dr. Aleen CampiPiscoya patient will have a second CT scan 07/05/17 and they can see if there is a issue.

## 2017-07-04 NOTE — Progress Notes (Signed)
Patient concern about getting more tylenol than she is suppose to have. Dr. Everlene FarrierPabon was notified and new order for tylenol 650mg  q6 put in at this time.

## 2017-07-05 ENCOUNTER — Inpatient Hospital Stay: Payer: Medicare Other

## 2017-07-05 ENCOUNTER — Encounter: Payer: Self-pay | Admitting: Radiology

## 2017-07-05 LAB — URINALYSIS, COMPLETE (UACMP) WITH MICROSCOPIC
Bacteria, UA: NONE SEEN
Bilirubin Urine: NEGATIVE
GLUCOSE, UA: NEGATIVE mg/dL
Hgb urine dipstick: NEGATIVE
KETONES UR: NEGATIVE mg/dL
Leukocytes, UA: NEGATIVE
Nitrite: NEGATIVE
PROTEIN: NEGATIVE mg/dL
Specific Gravity, Urine: 1.008 (ref 1.005–1.030)
WBC UA: NONE SEEN WBC/hpf (ref 0–5)
pH: 6 (ref 5.0–8.0)

## 2017-07-05 LAB — CBC WITH DIFFERENTIAL/PLATELET
BASOS ABS: 0 10*3/uL (ref 0–0.1)
BASOS PCT: 0 %
EOS PCT: 1 %
Eosinophils Absolute: 0.1 10*3/uL (ref 0–0.7)
HCT: 31.8 % — ABNORMAL LOW (ref 35.0–47.0)
Hemoglobin: 10.9 g/dL — ABNORMAL LOW (ref 12.0–16.0)
LYMPHS ABS: 1 10*3/uL (ref 1.0–3.6)
Lymphocytes Relative: 10 %
MCH: 30.4 pg (ref 26.0–34.0)
MCHC: 34.1 g/dL (ref 32.0–36.0)
MCV: 89.1 fL (ref 80.0–100.0)
Monocytes Absolute: 0.3 10*3/uL (ref 0.2–0.9)
Monocytes Relative: 3 %
NEUTROS PCT: 86 %
Neutro Abs: 8.5 10*3/uL — ABNORMAL HIGH (ref 1.4–6.5)
PLATELETS: 245 10*3/uL (ref 150–440)
RBC: 3.57 MIL/uL — AB (ref 3.80–5.20)
RDW: 13.4 % (ref 11.5–14.5)
WBC: 9.9 10*3/uL (ref 3.6–11.0)

## 2017-07-05 LAB — BASIC METABOLIC PANEL
ANION GAP: 5 (ref 5–15)
BUN: 7 mg/dL (ref 6–20)
CO2: 24 mmol/L (ref 22–32)
Calcium: 8 mg/dL — ABNORMAL LOW (ref 8.9–10.3)
Chloride: 105 mmol/L (ref 101–111)
Creatinine, Ser: 0.52 mg/dL (ref 0.44–1.00)
Glucose, Bld: 99 mg/dL (ref 65–99)
POTASSIUM: 3.2 mmol/L — AB (ref 3.5–5.1)
SODIUM: 134 mmol/L — AB (ref 135–145)

## 2017-07-05 LAB — C DIFFICILE QUICK SCREEN W PCR REFLEX
C DIFFICILE (CDIFF) INTERP: NOT DETECTED
C Diff antigen: NEGATIVE
C Diff toxin: NEGATIVE

## 2017-07-05 MED ORDER — IOPAMIDOL (ISOVUE-300) INJECTION 61%
15.0000 mL | INTRAVENOUS | Status: AC
  Administered 2017-07-05 (×2): 15 mL via ORAL

## 2017-07-05 MED ORDER — MELATONIN 5 MG PO TABS
10.0000 mg | ORAL_TABLET | Freq: Every evening | ORAL | Status: DC | PRN
  Administered 2017-07-06 – 2017-07-07 (×2): 10 mg via ORAL
  Filled 2017-07-05 (×3): qty 2

## 2017-07-05 MED ORDER — IOHEXOL 300 MG/ML  SOLN
100.0000 mL | Freq: Once | INTRAMUSCULAR | Status: AC | PRN
  Administered 2017-07-05: 100 mL via INTRAVENOUS

## 2017-07-05 MED ORDER — IOPAMIDOL (ISOVUE-300) INJECTION 61%: 100.0000 mL | Freq: Once | INTRAVENOUS | Status: DC | PRN

## 2017-07-05 NOTE — Progress Notes (Signed)
Patient ID: Donna Hammond, female   DOB: [birth year], 67 y.o.   MRN: [id number]     SURGICAL PROGRESS NOTE   Hospital Day(s): 3.   Post op day(s):  Marland Kitchen.   Interval History: Patient seen and examined, no acute events or new complaints overnight. Patient reports feel more swollen on her abdomen. Denies nausea or vomiting.  Vital signs in last 24 hours: [min-max] current  Temp:  [97.7 F (36.5 C)-99.3 F (37.4 C)] 99.3 F (37.4 C) (04/05 0542) Pulse Rate:  [99-111] 111 (04/05 0542) Resp:  [19-22] 19 (04/05 0542) BP: (111-134)/(61-66) 134/66 (04/05 0542) SpO2:  [92 %-97 %] 92 % (04/05 0542)     Height: 5\' 2"  (157.5 cm) Weight: 56.3 kg (124 lb 1.6 oz) BMI (Calculated): 22.69   Physical Exam:  Constitutional: alert, cooperative and no distress  Eyes: PERRL, EOM's grossly intact and symmetric  Respiratory: breathing non-labored at rest  Cardiovascular: regular rate and sinus rhythm  Gastrointestinal: soft, mild-tender on right lower quadrant, and distended  Labs:  CBC Latest Ref Rng & Units 07/05/2017 07/04/2017 07/03/2017  WBC 3.6 - 11.0 K/uL 9.9 9.6 5.6  Hemoglobin 12.0 - 16.0 g/dL 10.9(L) 10.5(L) 11.4(L)  Hematocrit 35.0 - 47.0 % 31.8(L) 31.1(L) 33.6(L)  Platelets 150 - 440 K/uL 245 210 229   CMP Latest Ref Rng & Units 07/05/2017 07/04/2017 07/03/2017  Glucose 65 - 99 mg/dL 99 40(J60(L) 84  BUN 6 - 20 mg/dL 7 13 15   Creatinine 0.44 - 1.00 mg/dL 8.110.52 9.140.63 7.820.82  Sodium 135 - 145 mmol/L 134(L) 133(L) 130(L)  Potassium 3.5 - 5.1 mmol/L 3.2(L) 3.1(L) 3.7  Chloride 101 - 111 mmol/L 105 103 101  CO2 22 - 32 mmol/L 24 22 22   Calcium 8.9 - 10.3 mg/dL 8.0(L) 8.0(L) 8.0(L)  Total Protein 6.5 - 8.1 g/dL - - -  Total Bilirubin 0.3 - 1.2 mg/dL - - -  Alkaline Phos 38 - 126 U/L - - -  AST 15 - 41 U/L - - -  ALT 14 - 54 U/L - - -    Assessment/Plan:  67 y.o. female with perforated appendicits and C. Diff colitis. Patient today more swollen and without improvement of pain. No significant leukocytosis. Will  order new CT scan for evaluation of possible abscess formation. Will repeat c diff for evaluation of response to abx therapy. If continue being positive will consider consulting Gastroenterologist.   Gae GallopEdgardo Cintrn-Daz, MD

## 2017-07-05 NOTE — Progress Notes (Signed)
Pharmacy Antibiotic Note  Donna Hammond is a 67 y.o. female admitted on 07/02/2017 with intra-abdominal infection. Pharmacy has been consulted for Zosyn dosing.  Patient has already received one dose of cefepime and metronidazole in the ED. Vancomycin ordered and will start Zosyn.   Plan: Zosyn 3.375g IV q8h (EI)    Height: 5\' 2"  (157.5 cm) Weight: 124 lb 1.6 oz (56.3 kg) IBW/kg (Calculated) : 50.1  Temp (24hrs), Avg:98.5 F (36.9 C), Min:97.7 F (36.5 C), Max:99.3 F (37.4 C)  Recent Labs  Lab 06/30/17 2235 07/02/17 1518 07/03/17 0251 07/04/17 0411 07/05/17 0433  WBC 8.8 13.8* 5.6 9.6 9.9  CREATININE 0.60 0.81 0.82 0.63 0.52    Estimated Creatinine Clearance: 54 mL/min (by C-G formula based on SCr of 0.52 mg/dL).    Allergies  Allergen Reactions  . Levaquin [Levofloxacin In D5w] Swelling    tingling    Antimicrobials this admission: 4/2 cefepime >> once 4/2 metronidazole >>  4/2 vancomycin >> 4/2 Zosyn >>  Dose adjustments this admission:   Microbiology results:   Thank you for allowing pharmacy to be a part of this patient's care.  Carola FrostNathan A Ronon Ferger, PharmD Clinical Pharmacist 07/05/2017 11:13 AM

## 2017-07-06 DIAGNOSIS — A0472 Enterocolitis due to Clostridium difficile, not specified as recurrent: Secondary | ICD-10-CM

## 2017-07-06 DIAGNOSIS — K3533 Acute appendicitis with perforation and localized peritonitis, with abscess: Principal | ICD-10-CM

## 2017-07-06 NOTE — Consult Note (Signed)
[facility] GI Inpatient Consult Note   Jamey Reas, M.D.  Reason for Consult: Clostridium Dificile colitis   Attending Requesting Consult: Carolan Shiver, M.D.   History of Present Illness: Donna Hammond is a 67 y.o. female admitted for colostomy reversal colitis as well as closed cecal perforation secondary to appendicitis.  The patient has been started on a regimen of oral vancomycin, IV metronidazole as well as IV Zosyn.  She is under close monitoring by the surgical service and appears to feel well other than having moderate abdominal distention and mild tenderness in the left lower quadrant.  Patient has questions regarding fecal transplant as a treatment for C. difficile colitis and would like information about my opinion on the subject.  Patient had a colonoscopy approximately 2 years ago in [place] revealing a small benign polyp, colonic diverticulosis and internal hemorrhoids per history.  Patient reportedly had a suboptimal colon preparation and subsequently underwent a stool colon guard test that came back negative.  Past Medical History:  Past Medical History:  Diagnosis Date  . Barrett esophagus   . Clostridium difficile diarrhea 06/24/2017  . Diverticulosis   . Emphysema lung (HCC)   . Hypertension     Problem List: Patient Active Problem List   Diagnosis Date Noted  . Acute perforated appendicitis 07/02/2017  . Ruptured appendicitis   . Enteritis due to Clostridium difficile     Past Surgical History: Past Surgical History:  Procedure Laterality Date  . ABDOMINAL HYSTERECTOMY      Allergies: Allergies  Allergen Reactions  . Levaquin [Levofloxacin In D5w] Swelling    tingling    Home Medications: Medications Prior to Admission  Medication Sig Dispense Refill Last Dose  . amLODipine (NORVASC) 10 MG tablet Take 10 mg by mouth daily.   07/01/2017 at Unknown time  . lisinopril (PRINIVIL,ZESTRIL) 20 MG tablet Take 20 mg by mouth daily.    07/02/2017 at Unknown time  . metroNIDAZOLE (FLAGYL) 500 MG tablet Take 1 tablet (500 mg total) by mouth 4 (four) times daily for 14 days. 56 tablet 0 07/02/2017 at Unknown time  . vancomycin (VANCOCIN) 125 MG capsule Take 125 mg by mouth 4 (four) times daily.   07/02/2017 at Unknown time  . dicyclomine (BENTYL) 20 MG tablet Take 20 mg by mouth every 6 (six) hours.   prn at prn   Home medication reconciliation was completed with the patient.   Scheduled Inpatient Medications:   . acetaminophen  650 mg Oral Q6H  . enoxaparin (LOVENOX) injection  40 mg Subcutaneous Q24H  . ketorolac  30 mg Intravenous Q6H  . metroNIDAZOLE  500 mg Oral Q8H  . pantoprazole (PROTONIX) IV  40 mg Intravenous QHS  . vancomycin  125 mg Oral QID    Continuous Inpatient Infusions:   . dextrose 5 % and 0.45 % NaCl with KCl 40 mEq/L 75 mL/hr at 07/05/17 2349  . piperacillin-tazobactam (ZOSYN)  IV 3.375 g (07/06/17 1410)    PRN Inpatient Medications:  HYDROmorphone (DILAUDID) injection, Melatonin, ondansetron **OR** ondansetron (ZOFRAN) IV, oxyCODONE  Family History: family history is not on file.   GI Family History: Negative.  Social History:   reports that she has been smoking cigarettes.  She has been smoking about 0.50 packs per day. She has never used smokeless tobacco. She reports that she does not drink alcohol or use drugs. The patient denies ETOH, tobacco, or drug use.    Review of Systems: Review of Systems - History obtained from the patient  General ROS: positive for  - Abdominal distention Psychological ROS: negative Hematological and Lymphatic ROS: negative Endocrine ROS: negative Respiratory ROS: no cough, shortness of breath, or wheezing Cardiovascular ROS: Patient had some chest pain but this was in close correlation to taking oral vancomycin.  She has been ruled out for myocardial infarction. Gastrointestinal ROS: negative for - blood in stools Genito-Urinary ROS: no dysuria, trouble voiding,  or hematuria Musculoskeletal ROS: negative Neurological ROS: no TIA or stroke symptoms Dermatological ROS: negative  Physical Examination: BP 121/80   Pulse 97   Temp 98.3 F (36.8 C) (Oral)   Resp 15   Ht 5\' 2"  (1.575 m)   Wt 56.3 kg (124 lb 1.6 oz)   SpO2 97%   BMI 22.70 kg/m  Physical Exam  Constitutional: She is oriented to person, place, and time. She appears well-developed and well-nourished.  Non-toxic appearance. She does not appear ill. No distress.  HENT:  Head: Normocephalic.  Mouth/Throat: Oropharynx is clear and moist.  Eyes: Pupils are equal, round, and reactive to light.  Cardiovascular: Normal rate and normal heart sounds. Exam reveals no gallop.  Pulmonary/Chest: Effort normal and breath sounds normal.  Abdominal: Bowel sounds are normal. She exhibits distension. There is no hepatosplenomegaly. There is tenderness in the right lower quadrant and left lower quadrant. There is no rigidity, no rebound and no guarding.  Musculoskeletal: Normal range of motion.  Lymphadenopathy:       Head (right side): No submandibular adenopathy present.       Head (left side): No submandibular adenopathy present.    She has no cervical adenopathy.  Neurological: She is oriented to person, place, and time. She has normal strength. She is not disoriented.  Skin: Skin is warm and dry. No rash noted.    Data: Lab Results  Component Value Date   WBC 9.9 07/05/2017   HGB 10.9 (L) 07/05/2017   HCT 31.8 (L) 07/05/2017   MCV 89.1 07/05/2017   PLT 245 07/05/2017   Recent Labs  Lab 07/03/17 0251 07/04/17 0411 07/05/17 0433  HGB 11.4* 10.5* 10.9*   Lab Results  Component Value Date   NA 134 (L) 07/05/2017   K 3.2 (L) 07/05/2017   CL 105 07/05/2017   CO2 24 07/05/2017   BUN 7 07/05/2017   CREATININE 0.52 07/05/2017   Lab Results  Component Value Date   ALT 17 07/02/2017   AST 34 07/02/2017   ALKPHOS 54 07/02/2017   BILITOT 0.8 07/02/2017   No results for input(s):  APTT, INR, PTT in the last 168 hours. CBC Latest Ref Rng & Units 07/05/2017 07/04/2017 07/03/2017  WBC 3.6 - 11.0 K/uL 9.9 9.6 5.6  Hemoglobin 12.0 - 16.0 g/dL 10.9(L) 10.5(L) 11.4(L)  Hematocrit 35.0 - 47.0 % 31.8(L) 31.1(L) 33.6(L)  Platelets 150 - 440 K/uL 245 210 229    STUDIES: Ct Abdomen Pelvis W Contrast  Result Date: 07/05/2017 CLINICAL DATA:  Abdominal pain and fever. Perforated appendicitis. C difficile colitis. Suspected abscess. EXAM: CT ABDOMEN AND PELVIS WITH CONTRAST TECHNIQUE: Multidetector CT imaging of the abdomen and pelvis was performed using the standard protocol following bolus administration of intravenous contrast. CONTRAST:  100mL OMNIPAQUE IOHEXOL 300 MG/ML  SOLN COMPARISON:  07/02/2017 FINDINGS: Lower Chest: New small bilateral pleural effusions and bibasilar atelectasis. Hepatobiliary: No hepatic masses identified. Gallbladder is unremarkable. Pancreas:  No mass or inflammatory changes. Spleen: Within normal limits in size and appearance. Adrenals/Urinary Tract: No masses identified. A few small left renal cysts are again  noted. No evidence of hydronephrosis. Unremarkable unopacified urinary bladder. Stomach/Bowel: Findings of acute appendicitis show no significant change. Several extraluminal appendicoliths and a tiny air bubble again seen in the periappendiceal soft tissues, consistent with ruptured appendicitis. No loculated abscess is seen, however there is increase in small amount of free fluid and peritoneal enhancement seen in the pelvic cul-de-sac, right paracolic gutter, and bilateral upper quadrants. No evidence of free intraperitoneal air or bowel obstruction. Sigmoid diverticulosis is again noted, without evidence of diverticulitis. Vascular/Lymphatic: No pathologically enlarged lymph nodes. No abdominal aortic aneurysm. Aortic atherosclerosis. Reproductive: Prior hysterectomy noted. Adnexal regions are unremarkable in appearance. Other:  None. Musculoskeletal:  No  suspicious bone lesions identified. IMPRESSION: Persistent findings of ruptured appendicitis, with mild increase in free fluid and peritoneal enhancement in the pelvis, right paracolic gutter, and bilateral upper quadrants. No focal abscess identified. New small bilateral pleural effusions and bibasilar atelectasis. Electronically Signed   By: Myles Rosenthal M.D.   On: 07/05/2017 16:47   @IMAGES @  Assessment: 1. Clostridium Dificile Colitis -  Stable On IV Flagyl and PO Vancomycin.  2. Ruptured appendicitis - Stable on IV Zosyn. 3. Abdominal distension without peritoneal signs on examination.  Recommendations: 1. Continue current medications. I agree with a minimum 2 week course of po vanc and flagyl at discharge. 2.  I discussed the prospect of fecal transplantation with the patient; I did tell her since this is her first documented case of Clostridium difficile associated diarrhea that the fecal transplant would be reserved in the case of treatment resistant cases with oral antibiotic therapy.  It is also consider technically unsafe to proceed with any kind of fecal transplantation given the ruptured appendicitis and the increased risk of perforation during colonoscopy and instilling stool using air insufflation into the ileocecal valve and the right colon.  Patient with technology my explanation and appears to be in agreement with current therapy which will continue.  I advised the patient to come see me at the hospital discharge to set up follow-up colonoscopy after review of old records.  I will sign off for now but please call me back in the interim if I can help.  Thank you for the consult. Please call with questions or concerns.  Rosina Lowenstein, "Mellody Dance MD [facility] [address] [place], [place] [address] [phone]  07/06/2017 3:55 PM

## 2017-07-06 NOTE — Progress Notes (Addendum)
SURGICAL PROGRESS NOTE (cpt [phone])  Hospital Day(s): 4.   Post op day(s):  Marland Kitchen   Interval History: Patient seen and examined, no acute events or new complaints overnight. Patient reports her abdominal pain has much improved with mild remaining Left-sided abdominal pain/discomfort, but persistent diarrhea despite negative follow-up C Diff PCR yesterday. Patient otherwise reports she's been tolerating clear liquids diet without N/V and denies fever/chills, CP, or SOB. Patient states her last colonoscopy was unable to be completed to the cecum due to poor quality prep, for which she underwent reportedly negative stool Cologuard test. Her last completed colonoscopy was 7 years ago. Patient also inquires regarding fecal transplantation for C Difficile colitis.  Review of Systems:  Constitutional: denies fever, chills  HEENT: denies cough or congestion  Respiratory: denies any shortness of breath  Cardiovascular: denies chest pain or palpitations  Gastrointestinal: abdominal pain, N/V, and bowel function as per interval history Genitourinary: denies burning with urination or urinary frequency Musculoskeletal: denies pain, decreased motor or sensation Integumentary: denies any other rashes or skin discolorations Neurological: denies HA or vision/hearing changes   Vital signs in last 24 hours: [min-max] current  Temp:  [98.1 F (36.7 C)-98.3 F (36.8 C)] 98.3 F (36.8 C) (04/06 0505) Pulse Rate:  [93-112] 93 (04/06 0505) Resp:  [16-20] 16 (04/06 0505) BP: (116-148)/(63-66) 116/63 (04/06 0505) SpO2:  [94 %-95 %] 94 % (04/06 0505)     Height: 5\' 2"  (157.5 cm) Weight: 124 lb 1.6 oz (56.3 kg) BMI (Calculated): 22.69   Intake/Output this shift:  Total I/O In: 110 [I.V.:110] Out: -    Intake/Output last 2 shifts:  @IOLAST2SHIFTS @   Physical Exam:  Constitutional: alert, cooperative and no distress  HENT: normocephalic without obvious abnormality  Eyes: PERRL, EOM's grossly intact and  symmetric  Neuro: CN II - XII grossly intact and symmetric without deficit  Respiratory: breathing non-labored at rest  Cardiovascular: regular rate and sinus rhythm  Gastrointestinal: soft and non-distended with mild Left-sided abdominal tenderness to palpation Musculoskeletal: UE and LE FROM, no edema or wounds, motor and sensation grossly intact, NT   Labs:  CBC Latest Ref Rng & Units 07/05/2017 07/04/2017 07/03/2017  WBC 3.6 - 11.0 K/uL 9.9 9.6 5.6  Hemoglobin 12.0 - 16.0 g/dL 10.9(L) 10.5(L) 11.4(L)  Hematocrit 35.0 - 47.0 % 31.8(L) 31.1(L) 33.6(L)  Platelets 150 - 440 K/uL 245 210 229   CMP Latest Ref Rng & Units 07/05/2017 07/04/2017 07/03/2017  Glucose 65 - 99 mg/dL 99 60(A) 84  BUN 6 - 20 mg/dL 7 13 15   Creatinine 0.44 - 1.00 mg/dL 5.40 9.81 1.91  Sodium 135 - 145 mmol/L 134(L) 133(L) 130(L)  Potassium 3.5 - 5.1 mmol/L 3.2(L) 3.1(L) 3.7  Chloride 101 - 111 mmol/L 105 103 101  CO2 22 - 32 mmol/L 24 22 22   Calcium 8.9 - 10.3 mg/dL 8.0(L) 8.0(L) 8.0(L)  Total Protein 6.5 - 8.1 g/dL - - -  Total Bilirubin 0.3 - 1.2 mg/dL - - -  Alkaline Phos 38 - 126 U/L - - -  AST 15 - 41 U/L - - -  ALT 14 - 54 U/L - - -   Imaging studies:  CT Abdomen and Pelvis with Contrast (07/05/2017) - personally reviewed, compared to prior study, and discussed with patient and her family Findings of acute appendicitis show no significant change. Several extraluminal appendicoliths and a tiny air bubble again seen in the periappendiceal soft tissues, consistent with ruptured appendicitis. No loculated abscess is seen, however there is  increase in small amount of free fluid and peritoneal enhancement seen in the pelvic cul-de-sac, right paracolic gutter, and bilateral upper quadrants. No evidence of free intraperitoneal air or bowel obstruction. Sigmoid diverticulosis is again noted, without evidence of diverticulitis. Prior hysterectomy noted.   Assessment/Plan: (ICD-10's: K35.33, 3004.72) 67 y.o. female with  acute perforated appendicitis with extensive free fluid without consolidated abscess, complicated by profuse watery diarrhea attributed to clostridium difficile and by pertinent comorbidities including HTN, emphysema, diverticulosis, and Barrett's esophagus.   - advance diet as tolerated, maintain hydration  - continue Zosyn + oral vancomycin and IV flagyl  - PO vanco + Flagyl 10 days beyond antibiotics for appendicitis  - anticipate outpatient colonoscopy upon resolution of acute issues  - medical management of comorbidities (home medications)  - monitor abdominal exam and bowel function  - DVT prophylaxis, ambulation encouraged  All of the above findings and recommendations were discussed with the patient, patient's family, and patient's RN, and all of patient's and family's questions were answered to their expressed satisfaction.  -- Scherrie GerlachJason E. Earlene Plateravis, MD, RPVI [place]: [facility] - Partnering for exceptional care. Office: [phone]

## 2017-07-06 NOTE — Plan of Care (Signed)
Multiple liquid BM today but per pt has improved. Tolerating advance in diet. Pain is controlled with scheduled Toradol and PRN oxy. Pt refuses tylenol.

## 2017-07-08 ENCOUNTER — Inpatient Hospital Stay: Payer: Medicare Other | Admitting: Anesthesiology

## 2017-07-08 ENCOUNTER — Inpatient Hospital Stay: Payer: Medicare Other

## 2017-07-08 ENCOUNTER — Encounter
Admission: EM | Disposition: A | Discharge status: Home / Self Care | Payer: Self-pay | Source: Home / Self Care | Attending: Surgery

## 2017-07-08 DIAGNOSIS — K3532 Acute appendicitis with perforation and localized peritonitis, without abscess: Secondary | ICD-10-CM

## 2017-07-08 HISTORY — PX: LAPAROTOMY: SHX154

## 2017-07-08 LAB — BASIC METABOLIC PANEL WITH GFR
Anion gap: 6 (ref 5–15)
BUN: <5 mg/dL — ABNORMAL LOW (ref 6–20)
CO2: 25 mmol/L (ref 22–32)
Calcium: 8.5 mg/dL — ABNORMAL LOW (ref 8.9–10.3)
Chloride: 100 mmol/L — ABNORMAL LOW (ref 101–111)
Creatinine, Ser: 0.44 mg/dL (ref 0.44–1.00)
GFR calc Af Amer: >60 mL/min
GFR calc non Af Amer: >60 mL/min
Glucose, Bld: 97 mg/dL (ref 65–99)
Potassium: 4.1 mmol/L (ref 3.5–5.1)
Sodium: 131 mmol/L — ABNORMAL LOW (ref 135–145)

## 2017-07-08 LAB — CBC
HEMATOCRIT: 35.2 % (ref 35.0–47.0)
Hemoglobin: 11.8 g/dL — ABNORMAL LOW (ref 12.0–16.0)
MCH: 29.6 pg (ref 26.0–34.0)
MCHC: 33.5 g/dL (ref 32.0–36.0)
MCV: 88.3 fL (ref 80.0–100.0)
PLATELETS: 363 10*3/uL (ref 150–440)
RBC: 3.98 MIL/uL (ref 3.80–5.20)
RDW: 13.9 % (ref 11.5–14.5)
WBC: 17.6 10*3/uL — AB (ref 3.6–11.0)

## 2017-07-08 SURGERY — LAPAROTOMY, EXPLORATORY
Anesthesia: General | Site: Abdomen | Wound class: Dirty or Infected

## 2017-07-08 MED ORDER — MIDAZOLAM HCL 2 MG/2ML IJ SOLN
INTRAMUSCULAR | Status: DC | PRN
  Administered 2017-07-08: 1 mg via INTRAVENOUS

## 2017-07-08 MED ORDER — ROCURONIUM BROMIDE 100 MG/10ML IV SOLN
INTRAVENOUS | Status: DC | PRN
  Administered 2017-07-08: 10 mg via INTRAVENOUS
  Administered 2017-07-08: 30 mg via INTRAVENOUS

## 2017-07-08 MED ORDER — IOPAMIDOL (ISOVUE-300) INJECTION 61%
15.0000 mL | INTRAVENOUS | Status: AC
  Administered 2017-07-08 (×2): 15 mL via ORAL

## 2017-07-08 MED ORDER — BUPIVACAINE-EPINEPHRINE (PF) 0.25% -1:200000 IJ SOLN
INTRAMUSCULAR | Status: AC
  Filled 2017-07-08: qty 30

## 2017-07-08 MED ORDER — HYDROMORPHONE HCL 1 MG/ML IJ SOLN
INTRAMUSCULAR | Status: AC
  Administered 2017-07-08: 0.5 mg via INTRAVENOUS
  Filled 2017-07-08: qty 1

## 2017-07-08 MED ORDER — DEXTROSE-NACL 5-0.45 % IV SOLN
INTRAVENOUS | Status: DC
  Administered 2017-07-09 – 2017-07-11 (×6): via INTRAVENOUS

## 2017-07-08 MED ORDER — HYDROMORPHONE HCL 1 MG/ML IJ SOLN
INTRAMUSCULAR | Status: AC
  Filled 2017-07-08: qty 1

## 2017-07-08 MED ORDER — FENTANYL CITRATE (PF) 100 MCG/2ML IJ SOLN
25.0000 ug | INTRAMUSCULAR | Status: AC | PRN
  Administered 2017-07-08 (×2): 25 ug via INTRAVENOUS

## 2017-07-08 MED ORDER — LIDOCAINE HCL (PF) 2 % IJ SOLN
INTRAMUSCULAR | Status: AC
  Filled 2017-07-08: qty 10

## 2017-07-08 MED ORDER — FENTANYL CITRATE (PF) 100 MCG/2ML IJ SOLN
25.0000 ug | INTRAMUSCULAR | Status: AC | PRN
  Administered 2017-07-08 (×6): 25 ug via INTRAVENOUS

## 2017-07-08 MED ORDER — SEVOFLURANE IN SOLN
RESPIRATORY_TRACT | Status: AC
  Filled 2017-07-08: qty 250

## 2017-07-08 MED ORDER — MIDAZOLAM HCL 2 MG/2ML IJ SOLN
INTRAMUSCULAR | Status: AC
  Filled 2017-07-08: qty 2

## 2017-07-08 MED ORDER — SUCCINYLCHOLINE CHLORIDE 20 MG/ML IJ SOLN
INTRAMUSCULAR | Status: AC
  Filled 2017-07-08: qty 1

## 2017-07-08 MED ORDER — FENTANYL CITRATE (PF) 100 MCG/2ML IJ SOLN
INTRAMUSCULAR | Status: AC
  Filled 2017-07-08: qty 2

## 2017-07-08 MED ORDER — LIDOCAINE HCL (CARDIAC) 20 MG/ML IV SOLN
INTRAVENOUS | Status: DC | PRN
  Administered 2017-07-08: 40 mg via INTRAVENOUS

## 2017-07-08 MED ORDER — ROCURONIUM BROMIDE 50 MG/5ML IV SOLN
INTRAVENOUS | Status: AC
  Filled 2017-07-08: qty 1

## 2017-07-08 MED ORDER — PROPOFOL 10 MG/ML IV BOLUS
INTRAVENOUS | Status: DC | PRN
  Administered 2017-07-08: 100 mg via INTRAVENOUS

## 2017-07-08 MED ORDER — FENTANYL CITRATE (PF) 100 MCG/2ML IJ SOLN
INTRAMUSCULAR | Status: DC | PRN
  Administered 2017-07-08 (×2): 50 ug via INTRAVENOUS

## 2017-07-08 MED ORDER — SUCCINYLCHOLINE CHLORIDE 20 MG/ML IJ SOLN
INTRAMUSCULAR | Status: DC | PRN
  Administered 2017-07-08: 80 mg via INTRAVENOUS

## 2017-07-08 MED ORDER — HYDROMORPHONE HCL 1 MG/ML IJ SOLN
0.5000 mg | INTRAMUSCULAR | Status: DC | PRN
  Administered 2017-07-08 – 2017-07-10 (×7): 0.5 mg via INTRAVENOUS
  Filled 2017-07-08 (×7): qty 0.5

## 2017-07-08 MED ORDER — ONDANSETRON HCL 4 MG/2ML IJ SOLN
INTRAMUSCULAR | Status: AC
  Filled 2017-07-08: qty 2

## 2017-07-08 MED ORDER — SUGAMMADEX SODIUM 500 MG/5ML IV SOLN
INTRAVENOUS | Status: DC | PRN
  Administered 2017-07-08: 120 mg via INTRAVENOUS

## 2017-07-08 MED ORDER — IOPAMIDOL (ISOVUE-300) INJECTION 61%
100.0000 mL | Freq: Once | INTRAVENOUS | Status: AC | PRN
  Administered 2017-07-08: 100 mL via INTRAVENOUS

## 2017-07-08 MED ORDER — DEXAMETHASONE SODIUM PHOSPHATE 10 MG/ML IJ SOLN
INTRAMUSCULAR | Status: DC | PRN
  Administered 2017-07-08: 10 mg via INTRAVENOUS

## 2017-07-08 MED ORDER — PROPOFOL 10 MG/ML IV BOLUS
INTRAVENOUS | Status: AC
  Filled 2017-07-08: qty 20

## 2017-07-08 MED ORDER — FENTANYL CITRATE (PF) 100 MCG/2ML IJ SOLN
INTRAMUSCULAR | Status: AC
  Administered 2017-07-08: 25 ug via INTRAVENOUS
  Filled 2017-07-08: qty 2

## 2017-07-08 MED ORDER — LACTATED RINGERS IV SOLN
INTRAVENOUS | Status: DC | PRN
  Administered 2017-07-08: 21:00:00 via INTRAVENOUS

## 2017-07-08 MED ORDER — SODIUM CHLORIDE 0.9 % IV BOLUS
1000.0000 mL | Freq: Once | INTRAVENOUS | Status: AC
  Administered 2017-07-08: 1000 mL via INTRAVENOUS

## 2017-07-08 MED ORDER — BUPIVACAINE HCL (PF) 0.25 % IJ SOLN
INTRAMUSCULAR | Status: DC | PRN
  Administered 2017-07-08: 30 mL

## 2017-07-08 MED ORDER — ONDANSETRON HCL 4 MG/2ML IJ SOLN: 4.0000 mg | Freq: Once | INTRAMUSCULAR | Status: DC | PRN

## 2017-07-08 MED ORDER — ONDANSETRON HCL 4 MG/2ML IJ SOLN
INTRAMUSCULAR | Status: DC | PRN
  Administered 2017-07-08: 4 mg via INTRAVENOUS

## 2017-07-08 MED ORDER — DEXAMETHASONE SODIUM PHOSPHATE 10 MG/ML IJ SOLN
INTRAMUSCULAR | Status: AC
  Filled 2017-07-08: qty 1

## 2017-07-08 MED ORDER — HYDROMORPHONE HCL 1 MG/ML IJ SOLN
0.5000 mg | INTRAMUSCULAR | Status: AC | PRN
  Administered 2017-07-08 (×4): 0.5 mg via INTRAVENOUS

## 2017-07-08 SURGICAL SUPPLY — 60 items
ADHESIVE MASTISOL STRL (MISCELLANEOUS) IMPLANT
APPLIER CLIP ROT 10 11.4 M/L (STAPLE) ×2
CANISTER SUCT 1200ML W/VALVE (MISCELLANEOUS) ×2 IMPLANT
CANISTER SUCT 3000ML PPV (MISCELLANEOUS) ×6 IMPLANT
CHLORAPREP W/TINT 26ML (MISCELLANEOUS) ×2 IMPLANT
CLIP APPLIE ROT 10 11.4 M/L (STAPLE) ×1 IMPLANT
COVER CLAMP SIL LG PBX B (MISCELLANEOUS) IMPLANT
DRAIN PENROSE 1/4X12 LTX (DRAIN) ×2 IMPLANT
DRAPE LAPAROTOMY 100X77 ABD (DRAPES) ×2 IMPLANT
DRAPE LEGGINS SURG 28X43 STRL (DRAPES) IMPLANT
DRSG OPSITE POSTOP 4X10 (GAUZE/BANDAGES/DRESSINGS) ×4 IMPLANT
DRSG OPSITE POSTOP 4X8 (GAUZE/BANDAGES/DRESSINGS) IMPLANT
DRSG TELFA 3X8 NADH (GAUZE/BANDAGES/DRESSINGS) IMPLANT
ELECT CAUTERY BLADE 6.4 (BLADE) ×2 IMPLANT
ELECT REM PT RETURN 9FT ADLT (ELECTROSURGICAL) ×2
ELECTRODE REM PT RTRN 9FT ADLT (ELECTROSURGICAL) ×1 IMPLANT
GAUZE SPONGE 4X4 12PLY STRL (GAUZE/BANDAGES/DRESSINGS) IMPLANT
GLOVE BIO SURGEON STRL SZ8 (GLOVE) ×4 IMPLANT
GLOVE INDICATOR 8.0 STRL GRN (GLOVE) ×4 IMPLANT
GOWN STRL REUS W/ TWL LRG LVL3 (GOWN DISPOSABLE) IMPLANT
GOWN STRL REUS W/ TWL XL LVL3 (GOWN DISPOSABLE) ×2 IMPLANT
GOWN STRL REUS W/TWL LRG LVL3 (GOWN DISPOSABLE)
GOWN STRL REUS W/TWL XL LVL3 (GOWN DISPOSABLE) ×2
JACKSON PRATT 10 (INSTRUMENTS) ×4 IMPLANT
KIT TURNOVER KIT A (KITS) ×2 IMPLANT
LABEL OR SOLS (LABEL) IMPLANT
NEEDLE HYPO 22GX1.5 SAFETY (NEEDLE) IMPLANT
NS IRRIG 1000ML POUR BTL (IV SOLUTION) ×2 IMPLANT
PACK BASIN MAJOR ARMC (MISCELLANEOUS) ×2 IMPLANT
PACK COLON CLEAN CLOSURE (MISCELLANEOUS) IMPLANT
RELOAD PROXIMATE 30MM BLUE (ENDOMECHANICALS) IMPLANT
RELOAD STAPLER LINEAR PROX 30 (STAPLE) IMPLANT
SEPRAFILM MEMBRANE 5X6 (MISCELLANEOUS) ×2 IMPLANT
SET YANKAUER POOLE SUCT (MISCELLANEOUS) ×2 IMPLANT
SPONGE DRAIN TRACH 4X4 STRL 2S (GAUZE/BANDAGES/DRESSINGS) ×4 IMPLANT
SPONGE LAP 18X18 5 PK (GAUZE/BANDAGES/DRESSINGS) IMPLANT
STAPLER PROXIMATE 75MM BLUE (STAPLE) IMPLANT
STAPLER RELOAD LINEAR PROX 30 (STAPLE)
STAPLER SKIN PROX 35W (STAPLE) ×2 IMPLANT
STRIP CLOSURE SKIN 1/2X4 (GAUZE/BANDAGES/DRESSINGS) IMPLANT
SUT ETHILON 3-0 FS-10 30 BLK (SUTURE) ×4
SUT MNCRL 3 0 RB1 (SUTURE) ×1 IMPLANT
SUT MNCRL 3-0 UNDYED SH (SUTURE) ×1 IMPLANT
SUT MONOCRYL 3 0 RB1 (SUTURE) ×1
SUT MONOCRYL 3-0 UNDYED (SUTURE) ×1
SUT PDS AB 1 CT1 27 (SUTURE) ×2 IMPLANT
SUT PDS AB 1 TP1 54 (SUTURE) ×4 IMPLANT
SUT SILK 0 (SUTURE) ×1
SUT SILK 0 30XBRD TIE 6 (SUTURE) ×1 IMPLANT
SUT SILK 3-0 (SUTURE) ×4 IMPLANT
SUT VIC AB 1 CTX 27 (SUTURE) ×2 IMPLANT
SUT VIC AB 2-0 CT1 27 (SUTURE) ×1
SUT VIC AB 2-0 CT1 TAPERPNT 27 (SUTURE) ×1 IMPLANT
SUT VIC AB 3-0 SH 27 (SUTURE) ×1
SUT VIC AB 3-0 SH 27X BRD (SUTURE) ×1 IMPLANT
SUT VICRYL 0 TIES 12 18 (SUTURE) ×2 IMPLANT
SUTURE EHLN 3-0 FS-10 30 BLK (SUTURE) ×2 IMPLANT
SWAB CULTURE AMIES ANAERIB BLU (MISCELLANEOUS) ×2 IMPLANT
SYR 10ML LL (SYRINGE) IMPLANT
TRAY FOLEY W/METER SILVER 16FR (SET/KITS/TRAYS/PACK) IMPLANT

## 2017-07-08 NOTE — Anesthesia Preprocedure Evaluation (Signed)
Anesthesia Evaluation  Patient identified by MRN, date of birth, ID band Patient awake    Reviewed: Allergy & Precautions, H&P , NPO status , Patient's Chart, lab work & pertinent test results, reviewed documented beta blocker date and time   History of Anesthesia Complications Negative for: history of anesthetic complications  Airway Mallampati: I  TM Distance: >3 FB Neck ROM: full    Dental  (+) Upper Dentures, Lower Dentures, Edentulous Upper, Edentulous Lower   Pulmonary shortness of breath and with exertion, neg sleep apnea, COPD,  COPD inhaler, neg recent URI, Current Smoker,           Cardiovascular Exercise Tolerance: Good hypertension, (-) angina(-) CAD, (-) Past MI and (-) Cardiac Stents (-) dysrhythmias (-) Valvular Problems/Murmurs     Neuro/Psych negative neurological ROS  negative psych ROS   GI/Hepatic Neg liver ROS, GERD  ,  Endo/Other  negative endocrine ROS  Renal/GU negative Renal ROS  negative genitourinary   Musculoskeletal   Abdominal   Peds  Hematology negative hematology ROS (+)   Anesthesia Other Findings Past Medical History: No date: Barrett esophagus 06/24/2017: Clostridium difficile diarrhea No date: Diverticulosis No date: Emphysema lung (HCC) No date: Hypertension   Reproductive/Obstetrics negative OB ROS                             Anesthesia Physical Anesthesia Plan  ASA: II  Anesthesia Plan: General   Post-op Pain Management:    Induction:   PONV Risk Score and Plan: 2 and Ondansetron and Dexamethasone  Airway Management Planned:   Additional Equipment:   Intra-op Plan:   Post-operative Plan:   Informed Consent: I have reviewed the patients History and Physical, chart, labs and discussed the procedure including the risks, benefits and alternatives for the proposed anesthesia with the patient or authorized representative who has indicated  his/her understanding and acceptance.   Dental Advisory Given  Plan Discussed with: Anesthesiologist, CRNA and Surgeon  Anesthesia Plan Comments:         Anesthesia Quick Evaluation

## 2017-07-08 NOTE — Progress Notes (Signed)
Pharmacy Antibiotic Note  Donna Hammond is a 67 y.o. female admitted on 07/02/2017 with intra-abdominal infection. Pharmacy has been consulted for Zosyn dosing.  Patient has already received one dose of cefepime and metronidazole in the ED. Vancomycin ordered and will start Zosyn.   Plan: Zosyn 3.375g IV q8h (EI)    Height: 5\' 2"  (157.5 cm) Weight: 124 lb 1.6 oz (56.3 kg) IBW/kg (Calculated) : 50.1  Temp (24hrs), Avg:98.8 F (37.1 C), Min:98.1 F (36.7 C), Max:99.4 F (37.4 C)  Recent Labs  Lab 07/02/17 1518 07/03/17 0251 07/04/17 0411 07/05/17 0433  WBC 13.8* 5.6 9.6 9.9  CREATININE 0.81 0.82 0.63 0.52    Estimated Creatinine Clearance: 54 mL/min (by C-G formula based on SCr of 0.52 mg/dL).    Allergies  Allergen Reactions  . Levaquin [Levofloxacin In D5w] Swelling    tingling    Antimicrobials this admission: 4/2 cefepime >> once 4/2 metronidazole >>  4/2 vancomycin >> 4/2 Zosyn >>  Dose adjustments this admission:   Microbiology results:   Thank you for allowing pharmacy to be a part of this patient's care.  Marty HeckWang, Teal Bontrager L, PharmD Clinical Pharmacist 07/08/2017 9:14 AM

## 2017-07-08 NOTE — Anesthesia Procedure Notes (Signed)
Procedure Name: Intubation Date/Time: 07/08/2017 8:53 PM Performed by: Waldo LaineJustis, Nareg Breighner, CRNA Pre-anesthesia Checklist: Patient identified, Patient being monitored, Timeout performed, Emergency Drugs available and Suction available Patient Re-evaluated:Patient Re-evaluated prior to induction Oxygen Delivery Method: Circle system utilized Preoxygenation: Pre-oxygenation with 100% oxygen Induction Type: IV induction, Rapid sequence and Cricoid Pressure applied Laryngoscope Size: Miller and 2 Grade View: Grade I Tube type: Oral Tube size: 7.0 mm Number of attempts: 1 Airway Equipment and Method: Stylet Placement Confirmation: ETT inserted through vocal cords under direct vision,  positive ETCO2 and breath sounds checked- equal and bilateral Secured at: 19 cm Tube secured with: Tape Dental Injury: Teeth and Oropharynx as per pre-operative assessment

## 2017-07-08 NOTE — Progress Notes (Signed)
Per Dr. Everlene FarrierPabon okay for RN to make pt NPO.

## 2017-07-08 NOTE — Progress Notes (Signed)
CHG bath given; SCDs initiated; family at bedside; bolus completed; family instructed to wait in waiting area for MD postop, acknowledged; To OR with OR orderly, via bed; Windy Carinaurner,Taylormarie Register K, RN 8:10 PM 07/08/2017

## 2017-07-08 NOTE — Progress Notes (Signed)
CC: perf appendicitis Subjective: Mild intermittent pain.  Diarrhea is much better.  White count is 17  Objective: Vital signs in last 24 hours: Temp:  [98.1 F (36.7 C)-99.4 F (37.4 C)] 99.4 F (37.4 C) (04/08 0607) Pulse Rate:  [85-107] 107 (04/08 0607) Resp:  [18-19] 19 (04/08 0607) BP: (100-132)/(53-62) 132/59 (04/08 0607) SpO2:  [94 %-99 %] 94 % (04/08 0607) Last BM Date: 07/08/17  Intake/Output from previous day: 04/07 0701 - 04/08 0700 In: 1445.8 [I.V.:1301.8; IV Piggyback:144] Out: 150 [Urine:150] Intake/Output this shift: Total I/O In: 737.5 [I.V.:687.5; IV Piggyback:50] Out: -   Physical exam: NAD Abd: soft, diffuse tenderness to palpation, no peritonitis,. Decrease bs and + distension  Lab Results: CBC  Recent Labs    07/08/17 0952  WBC 17.6*  HGB 11.8*  HCT 35.2  PLT 363   BMET Recent Labs    07/08/17 0952  NA 131*  K 4.1  CL 100*  CO2 25  GLUCOSE 97  BUN <5*  CREATININE 0.44  CALCIUM 8.5*   PT/INR No results for input(s): LABPROT, INR in the last 72 hours. ABG No results for input(s): PHART, HCO3 in the last 72 hours.  Invalid input(s): PCO2, PO2  Studies/Results: No results found.  Anti-infectives: Anti-infectives (From admission, onward)   Start     Dose/Rate Route Frequency Ordered Stop   07/02/17 2200  metroNIDAZOLE (FLAGYL) tablet 500 mg     500 mg Oral Every 8 hours 07/02/17 1930     07/02/17 2000  piperacillin-tazobactam (ZOSYN) IVPB 3.375 g     3.375 g 12.5 mL/hr over 240 Minutes Intravenous Every 8 hours 07/02/17 1821     07/02/17 1830  vancomycin (VANCOCIN) 50 mg/mL oral solution 125 mg     125 mg Oral 4 times daily 07/02/17 1818     07/02/17 1830  metroNIDAZOLE (FLAGYL) IVPB 500 mg  Status:  Discontinued     500 mg 100 mL/hr over 60 Minutes Intravenous Every 8 hours 07/02/17 1818 07/02/17 1930   07/02/17 1645  ceFEPIme (MAXIPIME) 2 g in sodium chloride 0.9 % 100 mL IVPB     2 g 200 mL/hr over 30 Minutes Intravenous  STAT 07/02/17 1642 07/02/17 1731   07/02/17 1645  metroNIDAZOLE (FLAGYL) IVPB 500 mg     500 mg 100 mL/hr over 60 Minutes Intravenous STAT 07/02/17 1642 07/02/17 1844      Assessment/Plan: 67 year old female with a history of perforated enthesitis managed conservatively.  She still has persistent pain and now her white count has jumped to 17,000.  Given these findings I will repeat a CT scan of the abdomen and pelvis.  I did explain to them that there is a chance that she may require surgical intervention if in fact she does not improve with medical therapy.  He is adamant on having surgical intervention at a different hospital.  She wanted to wait for the CT results before we make any transferring arrangements.  At this time she is not very genetic and does not need any emergent interventions at this time. Continue A/bs I spent over 40 minutes in this encounter with greater than 50% spent in coordination and counseling of her care  Sterling Bigiego Jaxn Chiquito, MD, [facility]  07/08/2017

## 2017-07-08 NOTE — Op Note (Signed)
07/08/2017 Corena Herter  Pre-operative Diagnosis: Acute ruptured appendicitis  Post-operative Diagnosis: Same Procedure: Exploratory laparotomy, drainage of pelvic and right upper quadrant abscess, appendectomy  Surgeon: Adah Salvage. Excell Seltzer, MD FACS  Anesthesia: Gen. with endotracheal tube  Assistant: Surgical tech  Procedure Details  The patient was seen again in the Holding Room. The benefits, complications, treatment options, and expected outcomes were discussed with the patient. The risks of bleeding, infection, recurrence of symptoms, failure to resolve symptoms,  bowel injury, any of which could require further surgery were reviewed with the patient.   The patient was taken to Operating Room, identified as Irma Roulhac and the procedure verified.  A Time Out was held and the above information confirmed.  Prior to the induction of general anesthesia, antibiotic prophylaxis was administered. VTE prophylaxis was in place. General endotracheal anesthesia was then administered and tolerated well. After the induction, the abdomen was prepped with Chloraprep and draped in the sterile fashion. The patient was positioned in the supine position.  Patient was induced to general anesthesia prepped and draped in a sterile fashion.  A midline incision was utilized to open explore the abdominal cavity.  Straw-colored minimally cloudy fluid exuded from the abdominal cavity which was aspirated.  A total of 1600 cc approximate were removed in terms of ascites.  The small bowel was run from the ligament of Treitz to the ileocecal valve.  The small bowel appeared fairly normal but dilated and fluid-filled.  The terminal ileum and cecum was in the far right upper quadrant.  The appendix was identified not so much in a retrocecal position but in the right upper quadrant adjacent to the liver and gallbladder.  Appendectomy was performed by dividing the base of the appendix and ligating with 2-0 Vicryl.  The  mesenteric vessels to the appendix were clipped or doubly clipped and the appendix was passed off for examination.  A small venous bleeder was identified in a figure-of-eight 2-0 Vicryl was placed to control this.  Attention was turned to the pelvis where fibril purulent exudate and a frank abscess was identified deep in the pelvis.  This was broken up with finger dissection and copious amounts of normal saline (3 L) were utilized to irrigate the pelvis and right upper quadrant.  There is no sign of bleeding.  It was checked multiple times.  The stump of the appendix was inspected and found to be intact and controlled.  210 mm JP drains were placed from the suprapubic area the one on the left was advanced to the pelvis and tied in with 3-0 nylon the one on the right was placed in the right paracolic gutter nearest the liver.  Nearest to the appendiceal stump.  These were both tied in with 3-0 nylon's.  Once assuring that hemostasis was adequate and the appendiceal stump was well controlled and the drains were in place the sponge lap needle count was correct.  A piece of Seprafilm was placed followed by closure of the abdominal wall with #1 PDS.  Marcaine was infiltrated into the skin is obtains tissues for a total of 30 cc and then the wound was closed over a Penrose drain which was held in with staples and the wound was closed with staples.  Dressing was placed.  Patient tolerated this procedure well there were no complications a Foley and nasogastric tube were left in place as were 2 JP drains in the Penrose drain.  She was taken to recovery room in stable condition to be  admitted for continued care.  Sponge lap needle counts correct.  Findings: Acute ruptured appendicitis with free floating stool balls in the right upper quadrant adjacent to the liver and gallbladder.  There was also an inflammatory process with fibril purulent exudate and abscess in the pelvis.   Estimated Blood Loss: 50 cc          Drains: Penrose in the midline incision subcutaneous, 2 JP drains one in the pelvis and one in the right paracolic gutter         Specimens: Free floating stool balls and appendix as well as cultures       Complications: None              Condition: Stable   Brittnie Lewey E. Excell Seltzerooper, MD, FACS

## 2017-07-08 NOTE — Anesthesia Post-op Follow-up Note (Signed)
Anesthesia QCDR form completed.        

## 2017-07-08 NOTE — Progress Notes (Signed)
SURGICAL PROGRESS NOTE (cpt [id number])  Hospital Day(s): 5.   Post op day(s):  Marland Kitchen.   Interval History: Patient seen and examined, no acute events or new complaints overnight. Patient reports she feels overall better, though continues to have multiple loose BM's (non-watery, non-explosive, but loose) and describes variable crampy abdominal discomfort that moves throughout the day. She specifically states her RLQ abdominal pain has resolved, and she denies any emesis, fever/chills, CP, or SOB. She also reports her primary source of some anxiety today is her [place] after [facility] and whether or not she will be able to arrange the recommended follow-up that's been discussed.  Review of Systems:  Constitutional: denies fever, chills  HEENT: denies cough or congestion  Respiratory: denies any shortness of breath  Cardiovascular: denies chest pain or palpitations  Gastrointestinal: abdominal pain, N/V, and bowel function as per interval history Genitourinary: denies burning with urination or urinary frequency Musculoskeletal: denies pain, decreased motor or sensation Integumentary: denies any other rashes or skin discolorations Neurological: denies HA or vision/hearing changes   Vital signs in last 24 hours: [min-max] current  Temp:  [98.1 F (36.7 C)-99.4 F (37.4 C)] 99.4 F (37.4 C) (04/08 0607) Pulse Rate:  [85-107] 107 (04/08 0607) Resp:  [18-19] 19 (04/08 0607) BP: (100-132)/(53-62) 132/59 (04/08 0607) SpO2:  [94 %-99 %] 94 % (04/08 0607)     Height: 5\' 2"  (157.5 cm) Weight: 124 lb 1.6 oz (56.3 kg) BMI (Calculated): 22.69   Intake/Output this shift:  Total I/O In: 546.3 [I.V.:546.3] Out: -    Intake/Output last 2 shifts:  @IOLAST2SHIFTS @   Physical Exam:  Constitutional: alert, cooperative and no distress  HENT: normocephalic without obvious abnormality  Eyes: PERRL, EOM's grossly intact and symmetric  Neuro: CN II -  XII grossly intact and symmetric without deficit  Respiratory: breathing non-labored at rest  Cardiovascular: regular rate and sinus rhythm  Gastrointestinal: soft, non-tender, and non-distended (but reported uncontrolled loose BM during exam) Musculoskeletal: UE and LE FROM, no edema or wounds, motor and sensation grossly intact, NT   Labs:  CBC Latest Ref Rng & Units 07/05/2017 07/04/2017 07/03/2017  WBC 3.6 - 11.0 K/uL 9.9 9.6 5.6  Hemoglobin 12.0 - 16.0 g/dL 10.9(L) 10.5(L) 11.4(L)  Hematocrit 35.0 - 47.0 % 31.8(L) 31.1(L) 33.6(L)  Platelets 150 - 440 K/uL 245 210 229   CMP Latest Ref Rng & Units 07/05/2017 07/04/2017 07/03/2017  Glucose 65 - 99 mg/dL 99 60(A60(L) 84  BUN 6 - 20 mg/dL 7 13 15   Creatinine 0.44 - 1.00 mg/dL 5.400.52 9.810.63 1.910.82  Sodium 135 - 145 mmol/L 134(L) 133(L) 130(L)  Potassium 3.5 - 5.1 mmol/L 3.2(L) 3.1(L) 3.7  Chloride 101 - 111 mmol/L 105 103 101  CO2 22 - 32 mmol/L 24 22 22   Calcium 8.9 - 10.3 mg/dL 8.0(L) 8.0(L) 8.0(L)  Total Protein 6.5 - 8.1 g/dL - - -  Total Bilirubin 0.3 - 1.2 mg/dL - - -  Alkaline Phos 38 - 126 U/L - - -  AST 15 - 41 U/L - - -  ALT 14 - 54 U/L - - -   Imaging studies: No new pertinent imaging studies   Assessment/Plan: (ICD-10's: K35.33, A04.72) 67 y.o. female with acute perforated appendicitis with extensive free fluid without consolidated abscess, complicated by profuse watery diarrhea attributed to clostridium difficile and by pertinent comorbidities including HTN, emphysema, diverticulosis, and Barrett's esophagus.              -  advance diet as tolerated, maintain hydration             - continue Zosyn + oral vancomycin and IV flagyl             - PO vanco + Flagyl 10 days beyond antibiotics for appendicitis             - anticipate outpatient colonoscopy upon resolution of acute issues             - medical management of comorbidities (home medications)             - monitor abdominal exam and bowel function             - DVT prophylaxis,  ambulation encouraged  All of the above findings and recommendations were discussed with the patient, patient's family, Dr. Norma Fredrickson, and patient's RN, and all of patient's and family's questions were answered to their expressed satisfaction.  -- Scherrie Gerlach Earlene Plater, MD, RPVI [place]: [facility] General Surgery - Partnering for exceptional care. Office: [phone]

## 2017-07-08 NOTE — Progress Notes (Signed)
Preoperative Review   Patient is met in the PACU. The history is reviewed in the chart and with the patient.  I have also discussed the patient's care with Dr. Dahlia Byes.  I have independently reviewed the patient's chart and CT scans of which there are multiple.  Patient appears to be worsening instead of improving.  I am in full agreement with Dr. Dahlia Byes and his recommendation for surgery tonight.  I have personally reviewed the options and rationale as well as the risks of this procedure that have been previously discussed with the patient.  Specifically I discussed the risks of recurrent abscess the risk of colon or small bowel resection with ostomy.  She also mentions that she has trouble with nasogastric tubes due to bleeding.  I believe that she really needs a nasogastric tube based on her CT findings but I will hold off on making that decision until the operation is fully assessed.  All questions asked by the patient and/or family were answered to their satisfaction.  At the patient's request I also discussed this with her husband who is in the waiting area at the time.  Daughter was not present as she was out getting her laptop computer.  Patient agrees to proceed with this procedure at this time.  Florene Glen M.D. FACS

## 2017-07-08 NOTE — Transfer of Care (Signed)
Immediate Anesthesia Transfer of Care Note  Patient: Donna Hammond  Procedure(s) Performed: EXPLORATORY LAPAROTOMY, appendectomy, drainage of abscesses (N/A Abdomen)  Patient Location: PACU  Anesthesia Type:General  Level of Consciousness: awake and patient cooperative  Airway & Oxygen Therapy: Patient Spontanous Breathing and Patient connected to face mask oxygen  Post-op Assessment: Report given to RN and Post -op Vital signs reviewed and stable  Post vital signs: Reviewed and stable  Last Vitals:  Vitals Value Taken Time  BP 138/78 07/08/2017 10:10 PM  Temp 36.2 C 07/08/2017 10:10 PM  Pulse 106 07/08/2017 10:11 PM  Resp 26 07/08/2017 10:11 PM  SpO2 98 % 07/08/2017 10:11 PM  Vitals shown include unvalidated device data.  Last Pain:  Vitals:   07/08/17 1959  TempSrc: Oral  PainSc: 4       Patients Stated Pain Goal: 0 (07/08/17 1959)  Complications: No apparent anesthesia complications

## 2017-07-08 NOTE — Progress Notes (Signed)
Pt seen And examined this evening. She clinically is getting worse with increased abdominal pain, nausea, persistent tachycardia and  low-grade temperature. WBC rising now 17 k. CT scan personally reviewed and discussed with radiologist. Persistent rupture appendicitis with worsening ascites now peritoneal enhancement c consistent with peritonitis.  There is also dilated loops of small bowel consistent with ileus. No evidence of toxic megacolon  PE She is c/o pain Abd more distended, now w rebound tenderness concerning for peritonitis  A/p Pt w rupture appendicitis C. difficile colitis now with clinical progression of her disease.  Not responding to antibiotic therapy and showing signs of peritonitis. I had a lengthy discussion with the patient and the family and we agree that surgical intervention is indicated.   I note that in the past patient has said that she wanted to be transferred if surgical intervention was required.  I did have extensive discussion with the family and my concern that transferring the patient will delay her care and she may potentially have more complications relating to a delay in definitive surgical care. For all those reasons I do recommend emergent intervention tonight. We will start fluid resuscitation and post her for laparotomy. D/W the pt in detail about the procedure the chance of colectomy, ostomy, open abdomen. She understands and wishes to proceed. I just called the OR and currently Ortho is finishing up a Hip. We should be able to start the case following that and Dr. Excell Seltzerooper my partner will be performing the actual surgery. They understand and agree with the plan. All questions were answered.

## 2017-07-09 ENCOUNTER — Encounter: Payer: Self-pay | Admitting: Surgery

## 2017-07-09 LAB — BASIC METABOLIC PANEL
ANION GAP: 6 (ref 5–15)
BUN: 7 mg/dL (ref 6–20)
CALCIUM: 7.5 mg/dL — AB (ref 8.9–10.3)
CO2: 25 mmol/L (ref 22–32)
Chloride: 99 mmol/L — ABNORMAL LOW (ref 101–111)
Creatinine, Ser: 0.39 mg/dL — ABNORMAL LOW (ref 0.44–1.00)
Glucose, Bld: 136 mg/dL — ABNORMAL HIGH (ref 65–99)
Potassium: 3.9 mmol/L (ref 3.5–5.1)
Sodium: 130 mmol/L — ABNORMAL LOW (ref 135–145)

## 2017-07-09 LAB — CBC WITH DIFFERENTIAL/PLATELET
Basophils Absolute: 0 10*3/uL (ref 0–0.1)
Basophils Relative: 0 %
EOS ABS: 0 10*3/uL (ref 0–0.7)
Eosinophils Relative: 0 %
HCT: 32.1 % — ABNORMAL LOW (ref 35.0–47.0)
HEMOGLOBIN: 10.9 g/dL — AB (ref 12.0–16.0)
LYMPHS ABS: 0.7 10*3/uL — AB (ref 1.0–3.6)
LYMPHS PCT: 4 %
MCH: 29.7 pg (ref 26.0–34.0)
MCHC: 34 g/dL (ref 32.0–36.0)
MCV: 87.4 fL (ref 80.0–100.0)
Monocytes Absolute: 0.5 10*3/uL (ref 0.2–0.9)
Monocytes Relative: 2 %
NEUTROS PCT: 94 %
Neutro Abs: 20.3 10*3/uL — ABNORMAL HIGH (ref 1.4–6.5)
Platelets: 379 10*3/uL (ref 150–440)
RBC: 3.67 MIL/uL — ABNORMAL LOW (ref 3.80–5.20)
RDW: 14.3 % (ref 11.5–14.5)
WBC: 21.6 10*3/uL — ABNORMAL HIGH (ref 3.6–11.0)

## 2017-07-09 MED ORDER — METRONIDAZOLE IN NACL 5-0.79 MG/ML-% IV SOLN
500.0000 mg | Freq: Three times a day (TID) | INTRAVENOUS | Status: DC
  Administered 2017-07-09: 500 mg via INTRAVENOUS
  Filled 2017-07-09 (×3): qty 100

## 2017-07-09 MED ORDER — PHENOL 1.4 % MT LIQD
1.0000 | OROMUCOSAL | Status: DC | PRN
  Administered 2017-07-10 – 2017-07-11 (×4): 1 via OROMUCOSAL
  Filled 2017-07-09: qty 177

## 2017-07-09 NOTE — Progress Notes (Signed)
POD # 1 AVSS No flatus  PE NAD WUJ:[id number]:dressing intact, significant improvement as compared to yesterday, no peritonitis.  A/p ileus continue ngt Continue a/bs Mobilize Dc foley

## 2017-07-09 NOTE — Progress Notes (Signed)
Initial Nutrition Assessment  DOCUMENTATION CODES:   Not applicable  INTERVENTION:   RD will monitor for diet advancement vs the need for nutrition support  Pt at high refeeding risk; recommend monitor K, Mg, and P labs   NUTRITION DIAGNOSIS:   Inadequate oral intake related to acute illness as evidenced by NPO status.  GOAL:   Patient will meet greater than or equal to 90% of their needs  MONITOR:   Diet advancement, Labs, Weight trends, I & O's  REASON FOR ASSESSMENT:   NPO/Clear Liquid Diet    ASSESSMENT:   67 y.o. female with acute perforated appendicitis with extensive free fluid without consolidated abscess, complicated by profuse watery diarrhea attributed to clostridium difficile and by pertinent comorbidities including HTN, emphysema, diverticulosis, and Barrett's esophagus. Pt now s/p ex lap with appendectomy 4/8   Met with pt in room today. Pt on NPO/clear liquid diet since admit. Pt was initiated on soft diet for 1-2 days but pt reports she only ate yogurt, a few bites of spaghetti, and some sweet potatoes. Pt now without adequate oral intake for 7 days. Spoke to MD Pabon; plan to initiate TPN if no improvement by 4/10. Pt noted to have dilated bowel loops on CT scan. Pt initiated on dextrose today; pt at high refeeding risk. Will plan to monitor K, Mg, and P labs. Per chart, pt is weight stable. Pt reports her UBW ~125lbs. NGT in place; 136m x 24 hrs. Pt reports moderate pain and distended abdomen; pt reports this is improved today. JP drains in place; 1048moutput x 24 hrs. Pt with continued diarrhea; pt with C-diff. Per chart, pt wears dentures. Pt is willing to try supplements when diet advanced; pt does not like artificial sweeteners.   Medications reviewed and include: lovenox, metronidazole, protonix, vancomycin, NaCl with 5% dextrose @100ml /hr, zosyn, hydromorphone  Labs reviewed:  Na 130(L), K 3.9 wnl, creat 0.39(L), Ca 7.5(L) Wbc- 21.6(H), Hgb 10.9(L), Hct  32.1(L)  NUTRITION - FOCUSED PHYSICAL EXAM:    Most Recent Value  Orbital Region  Mild depletion  Upper Arm Region  No depletion  Thoracic and Lumbar Region  No depletion  Buccal Region  No depletion  Temple Region  No depletion  Clavicle Bone Region  Moderate depletion  Clavicle and Acromion Bone Region  Moderate depletion  Scapular Bone Region  Unable to assess  Dorsal Hand  Unable to assess  Patellar Region  No depletion  Anterior Thigh Region  No depletion  Posterior Calf Region  No depletion  Edema (RD Assessment)  Mild [hands]  Hair  Reviewed  Eyes  Reviewed  Mouth  Reviewed  Skin  Reviewed  Nails  Reviewed     Diet Order:  Diet NPO time specified Except for: Sips with Meds, Ice Chips  EDUCATION NEEDS:   Education needs have been addressed  Skin:  Skin Assessment: (incision abdomen )  Last BM:  4/8- type 6  Height:   Ht Readings from Last 1 Encounters:  07/02/17 5' 2"  (1.575 m)    Weight:   Wt Readings from Last 1 Encounters:  07/02/17 124 lb 1.6 oz (56.3 kg)    Ideal Body Weight:  50 kg  BMI:  Body mass index is 22.7 kg/m.  Estimated Nutritional Needs:   Kcal:  1300-1500kcal/day   Protein:  68-79g/day   Fluid:  >1.4L/day   CaKoleen DistanceS, RD, LDN Pager #- [phone] Hours Pager: [phone]

## 2017-07-09 NOTE — Progress Notes (Signed)
Dr. Excell Seltzerooper notified of low urine output versus IVF input; acknowledged; no new orders; will continue to monitor. Windy Carinaurner,Keldrick Pomplun K, RN 4/9/20195:49 AM

## 2017-07-09 NOTE — Progress Notes (Signed)
RN removed foley per order. Pt is due to void by 2200 07/09/2017. Will continue to monitor pt.   Donna Hammond [place]

## 2017-07-09 NOTE — Progress Notes (Signed)
[facility] CLINIC INFECTIOUS DISEASE PROGRESS NOTE Date of Admission:  07/02/2017     ID: Donna Hammond is a 67 y.o. female with ruptured appendicitis  Active Problems:   Acute perforated appendicitis   Enteritis due to Clostridium difficile   Subjective: S/p lap 4/8 Diarrhea resolved  Findings: Acute ruptured appendicitis with free floating stool balls in the right upper quadrant adjacent to the liver and gallbladder.  There was also an inflammatory process with fibril purulent exudate and abscess in the pelvis.    ROS  Eleven systems are reviewed and negative except per hpi  Medications:  Antibiotics Given (last 72 hours)    Date/Time Action Medication Dose Rate   07/06/17 1806 Given   vancomycin (VANCOCIN) 50 mg/mL oral solution 125 mg 125 mg    07/06/17 2220 Given   vancomycin (VANCOCIN) 50 mg/mL oral solution 125 mg 125 mg    07/06/17 2220 New Bag/Given   piperacillin-tazobactam (ZOSYN) IVPB 3.375 g 3.375 g 12.5 mL/hr   07/06/17 2221 Given   metroNIDAZOLE (FLAGYL) tablet 500 mg 500 mg    07/07/17 0538 New Bag/Given   piperacillin-tazobactam (ZOSYN) IVPB 3.375 g 3.375 g 12.5 mL/hr   07/07/17 0538 Given   metroNIDAZOLE (FLAGYL) tablet 500 mg 500 mg    07/07/17 0858 Given   vancomycin (VANCOCIN) 50 mg/mL oral solution 125 mg 125 mg    07/07/17 1414 Given   vancomycin (VANCOCIN) 50 mg/mL oral solution 125 mg 125 mg    07/07/17 1414 Given   metroNIDAZOLE (FLAGYL) tablet 500 mg 500 mg    07/07/17 1415 New Bag/Given   piperacillin-tazobactam (ZOSYN) IVPB 3.375 g 3.375 g 12.5 mL/hr   07/07/17 1800 Given   vancomycin (VANCOCIN) 50 mg/mL oral solution 125 mg 125 mg    07/07/17 2130 Given   metroNIDAZOLE (FLAGYL) tablet 500 mg 500 mg    07/07/17 2131 New Bag/Given   piperacillin-tazobactam (ZOSYN) IVPB 3.375 g 3.375 g 12.5 mL/hr   07/07/17 2312 Given   vancomycin (VANCOCIN) 50 mg/mL oral solution 125 mg 125 mg    07/08/17 0520 New Bag/Given   piperacillin-tazobactam  (ZOSYN) IVPB 3.375 g 3.375 g 12.5 mL/hr   07/08/17 0520 Given   metroNIDAZOLE (FLAGYL) tablet 500 mg 500 mg    07/08/17 0914 Given   vancomycin (VANCOCIN) 50 mg/mL oral solution 125 mg 125 mg    07/08/17 1319 New Bag/Given   piperacillin-tazobactam (ZOSYN) IVPB 3.375 g 3.375 g 12.5 mL/hr   07/08/17 1501 Given   vancomycin (VANCOCIN) 50 mg/mL oral solution 125 mg 125 mg    07/08/17 1501 Given   metroNIDAZOLE (FLAGYL) tablet 500 mg 500 mg    07/08/17 1803 Given   vancomycin (VANCOCIN) 50 mg/mL oral solution 125 mg 125 mg    07/08/17 2113 Given   piperacillin-tazobactam (ZOSYN) IVPB 3.375 g 3.375 g    07/09/17 0524 New Bag/Given   piperacillin-tazobactam (ZOSYN) IVPB 3.375 g 3.375 g 12.5 mL/hr   07/09/17 0530 Given  [NGT clamped]   metroNIDAZOLE (FLAGYL) tablet 500 mg 500 mg    07/09/17 1027 Given   vancomycin (VANCOCIN) 50 mg/mL oral solution 125 mg 125 mg    07/09/17 1350 New Bag/Given   piperacillin-tazobactam (ZOSYN) IVPB 3.375 g 3.375 g 12.5 mL/hr     . enoxaparin (LOVENOX) injection  40 mg Subcutaneous Q24H  . pantoprazole (PROTONIX) IV  40 mg Intravenous QHS  . vancomycin  125 mg Oral QID    Objective: Vital signs in last 24 hours: Temp:  [97.2  F (36.2 C)-99.6 F (37.6 C)] 99.3 F (37.4 C) (04/09 1227) Pulse Rate:  [87-109] 87 (04/09 1227) Resp:  [14-26] 18 (04/09 1227) BP: (105-138)/(53-92) 108/57 (04/09 1227) SpO2:  [90 %-100 %] 92 % (04/09 1227) FiO2 (%):  [6 %] 6 % (04/08 2210) Constitutional: She is oriented to person, place, and time. sitting up in chair Mouth/Throat: Oropharynx is clear and moist. No oropharyngeal exudate.  Cardiovascular: Normal rate, regular rhythm and normal heart sounds. Exam reveals no gallop and no friction rub.  No murmur heard.  Pulmonary/Chest: Effort normal and breath sounds normal. No respiratory distress. He has no wheezes.  Abdominal: Soft. midline incision covered with honeycomb dressing, jp drains with ss drianage Lymph She  has no cervical adenopathy.  Neurological: She is alert and oriented to person, place, and time.     Lab Results Recent Labs    07/08/17 0952 07/09/17 0504  WBC 17.6* 21.6*  HGB 11.8* 10.9*  HCT 35.2 32.1*  NA 131* 130*  K 4.1 3.9  CL 100* 99*  CO2 25 25  BUN <5* 7  CREATININE 0.44 0.39*    Microbiology: Results for orders placed or performed during the hospital encounter of 07/02/17  C difficile quick scan w PCR reflex     Status: None   Collection Time: 07/05/17  2:02 PM  Result Value Ref Range Status   C Diff antigen NEGATIVE NEGATIVE Final   C Diff toxin NEGATIVE NEGATIVE Final   C Diff interpretation No C. difficile detected.  Final    Comment: Performed at [facility], [address] [place], [place] [address]  Aerobic/Anaerobic Culture (surgical/deep wound)     Status: None (Preliminary result)   Collection Time: 07/08/17  9:07 PM  Result Value Ref Range Status   Specimen Description   Final    WOUND Performed at [facility], [address], [place], [place] [address]    Special Requests   Final    NONE Performed at [facility], [address] [place], [place] [address]    Gram Stain   Final    FEW WBC PRESENT, PREDOMINANTLY PMN NO ORGANISMS SEEN Performed at [facility], [address], [place], [place] [address]    Culture PENDING  Incomplete   Report Status PENDING  Incomplete    Studies/Results: Ct Abdomen Pelvis W Contrast  Result Date: 07/08/2017 CLINICAL DATA:  67 year old female with appendicitis. Intra-abdominal C difficile. Watery diarrhea. Subsequent encounter. EXAM: CT ABDOMEN AND PELVIS WITH CONTRAST TECHNIQUE: Multidetector CT imaging of the abdomen and pelvis was performed using the standard protocol following bolus administration of intravenous contrast. CONTRAST:  ISOVUE-300 IOPAMIDOL (ISOVUE-300) INJECTION 61% COMPARISON:  07/05/2017 and 07/02/2017 CT. FINDINGS: Lower chest: Bibasilar  atelectasis and small pleural effusions. Mitral aortic valve calcification.  Heart size within normal limits. Hepatobiliary: No worrisome hepatic lesion. Contracted gallbladder with mucosal enhancement. Pancreas: No pancreatic mass or primary pancreatic inflammation. Spleen: No splenic mass or enlargement. Adrenals/Urinary Tract: No hydronephrosis or obstructing lesion. Left renal cysts unchanged. No adrenal mass. Contracted urinary bladder with limited evaluation. Stomach/Bowel: Findings consistent with acute ruptured appendicitis (appendicoliths within the inflamed appendix and also extraluminal adjacent to the appendix) once again noted. Appendix is superiorly located just below the kidney and liver. Significant progression of ascites extending throughout the abdomen to the diaphragm, in the paracolic gutters, within the mesentery and throughout the pelvis with peritoneal enhancement suggesting peritonitis. Dilated gas and fluid-filled small bowel loops. Findings may reflect  ileus related to peritonitis. Fluid-filled cecum with decompressed transverse colon. As there is a reported history of C difficile, this may also contributing to described findings. Diverticula most notable sigmoid colon. Vascular/Lymphatic: Prominent vascular calcifications abdominal aorta without abdominal aortic aneurysm. Atherosclerotic changes aortic branch vessels with narrowing of the origin of the superior mesenteric artery and inferior mesenteric artery. Narrowing origin renal arteries and the common iliac arteries. Scattered top-normal size lymph nodes. Reproductive: Prior hysterectomy.  No worrisome adnexal mass. Other: Mild third spacing of fluid subcutaneous region. Musculoskeletal: Prominent degenerative changes L5-S1. IMPRESSION: Findings consistent with acute ruptured appendicitis (appendicoliths within the inflamed appendix and also extraluminal adjacent to the appendix) once again noted. Appendix is superiorly located just  below the kidney and liver. Significant progression of ascites extending throughout the abdomen to the diaphragm, in the paracolic gutters, within the mesentery and throughout the pelvis with peritoneal enhancement suggesting peritonitis. Dilated gas and fluid-filled small bowel loops. Findings may reflect ileus related to peritonitis. Fluid-filled cecum with decompressed transverse colon. As there is a reported history of C difficile, this may also contributing to described findings. Aortic Atherosclerosis (ICD10-I70.0). Narrowing origin renal arteries, superior mesenteric artery, inferior mesenteric artery and common iliac arteries. Small pleural effusions and bibasilar atelectasis. These results were called by telephone at the time of interpretation on 07/08/2017 at 3:12 pm to Dr. Sterling BigIEGO PABON , who verbally acknowledged these results. Electronically Signed   By: Lacy DuverneySteven  Olson M.D.   On: 07/08/2017 15:31    Assessment/Plan: Corena Herterlicia Deridder is a 67 y.o. female admitted with ruptured appendicitis following a recent dx of C diff being treated as otpt with vancomycin. Since admit stools are now firmer, wbc down and less pain. Conservative surgical approach at this point.  4/4 no fevers, wbc 9, pain a little better 4/9 - s/p surg 4/8 -  Findings: Acute ruptured appendicitis with free floating stool balls in the right upper quadrant adjacent to the liver and gallbladder.  There was also an inflammatory process with fibril purulent exudate and abscess in the pelvis.   Recommendations Appendicitis- cont zosyn - can dc IV flagyl. Follow clinical exam - cx pending Can add antifungal if worsens.  C diff -diarrhea improved and fu C diff neg -  cont oral vanco.Will need 10 days more of oral vanco when she finishes her abx course for the appendicitis.    Thank you very much for the consult. Will follow with you.  Mick SellDavid P Ameli Sangiovanni   07/09/2017, 2:55 PM

## 2017-07-09 NOTE — Anesthesia Postprocedure Evaluation (Signed)
Anesthesia Post Note  Patient: [name]  Procedure(s) Performed: EXPLORATORY LAPAROTOMY, appendectomy, drainage of abscesses (N/A Abdomen)  Patient location during evaluation: PACU Anesthesia Type: General Level of consciousness: awake and alert Pain management: pain level controlled Vital Signs Assessment: post-procedure vital signs reviewed and stable Respiratory status: spontaneous breathing, nonlabored ventilation, respiratory function stable and patient connected to nasal cannula oxygen Cardiovascular status: blood pressure returned to baseline and stable Postop Assessment: no apparent nausea or vomiting Anesthetic complications: no     Last Vitals:  Vitals:   07/08/17 2340 07/09/17 0000  BP: (!) 124/56 (!) 118/55  Pulse: (!) 104 99  Resp: (!) 22 18  Temp: 37.4 C 37.4 C  SpO2: 91% 91%    Last Pain:  Vitals:   07/09/17 0000  TempSrc: Oral  PainSc:                  Lenard SimmerAndrew Joas Motton

## 2017-07-09 NOTE — Progress Notes (Signed)
Visited patient. History reviewed with Dr. Everlene FarrierPabon Patient feels well today think she will pass gas soon no nausea or vomiting Vital signs reviewed nasogastric tube in place abdomen is soft much less distended nontender wound is dressed.  Drains are in place with serous fluid only.  Continue current care.  Remove nasogastric tube once bowel function returns

## 2017-07-10 ENCOUNTER — Inpatient Hospital Stay: Payer: Medicare Other

## 2017-07-10 LAB — BASIC METABOLIC PANEL
ANION GAP: 4 — AB (ref 5–15)
BUN: 7 mg/dL (ref 6–20)
CO2: 27 mmol/L (ref 22–32)
CREATININE: 0.48 mg/dL (ref 0.44–1.00)
Calcium: 7.6 mg/dL — ABNORMAL LOW (ref 8.9–10.3)
Chloride: 99 mmol/L — ABNORMAL LOW (ref 101–111)
GFR calc non Af Amer: >60 mL/min (ref >60)
Glucose, Bld: 128 mg/dL — ABNORMAL HIGH (ref 65–99)
Potassium: 3.3 mmol/L — ABNORMAL LOW (ref 3.5–5.1)
SODIUM: 130 mmol/L — AB (ref 135–145)

## 2017-07-10 LAB — CBC
HCT: 28.9 % — ABNORMAL LOW (ref 35.0–47.0)
HEMOGLOBIN: 9.8 g/dL — AB (ref 12.0–16.0)
MCH: 29.8 pg (ref 26.0–34.0)
MCHC: 34 g/dL (ref 32.0–36.0)
MCV: 87.5 fL (ref 80.0–100.0)
Platelets: 436 10*3/uL (ref 150–440)
RBC: 3.3 MIL/uL — AB (ref 3.80–5.20)
RDW: 13.6 % (ref 11.5–14.5)
WBC: 16.2 10*3/uL — AB (ref 3.6–11.0)

## 2017-07-10 LAB — MAGNESIUM: MAGNESIUM: 1.7 mg/dL (ref 1.7–2.4)

## 2017-07-10 LAB — PHOSPHORUS: PHOSPHORUS: 2.6 mg/dL (ref 2.5–4.6)

## 2017-07-10 LAB — SURGICAL PATHOLOGY

## 2017-07-10 MED ORDER — POTASSIUM CHLORIDE 10 MEQ/100ML IV SOLN
10.0000 meq | Freq: Once | INTRAVENOUS | Status: AC
  Administered 2017-07-10: 10 meq via INTRAVENOUS
  Filled 2017-07-10: qty 100

## 2017-07-10 MED ORDER — POTASSIUM CHLORIDE 10 MEQ/100ML IV SOLN
10.0000 meq | INTRAVENOUS | Status: AC
  Administered 2017-07-10 (×2): 10 meq via INTRAVENOUS
  Filled 2017-07-10 (×2): qty 100

## 2017-07-10 MED ORDER — HYDROMORPHONE HCL 1 MG/ML IJ SOLN
0.5000 mg | INTRAMUSCULAR | Status: DC | PRN
  Administered 2017-07-10 – 2017-07-13 (×10): 0.5 mg via INTRAVENOUS
  Filled 2017-07-10 (×10): qty 1

## 2017-07-10 NOTE — Progress Notes (Signed)
POD # 2 AVSS No flatus NGT 700cc  PE NAD ZOX:WRUEAVWUAbd:dressing intact, no infection, drain serous fluids. No peritonitis  A/p ileus continue ngt We will hold on PICC and TPN today, pt wishes to wait one more day Continue a/bs, appreciate ID now on zosyn and PO vanc Mobilize

## 2017-07-10 NOTE — Progress Notes (Signed)
ID e note WBC down, no fevers, cx neg to date  Rec Cont current abx and follow.

## 2017-07-11 ENCOUNTER — Inpatient Hospital Stay: Payer: Self-pay

## 2017-07-11 LAB — BASIC METABOLIC PANEL
Anion gap: 6 (ref 5–15)
BUN: <5 mg/dL — ABNORMAL LOW (ref 6–20)
CHLORIDE: 96 mmol/L — AB (ref 101–111)
CO2: 29 mmol/L (ref 22–32)
Calcium: 8 mg/dL — ABNORMAL LOW (ref 8.9–10.3)
Creatinine, Ser: 0.44 mg/dL (ref 0.44–1.00)
GFR calc Af Amer: >60 mL/min (ref >60)
GFR calc non Af Amer: >60 mL/min (ref >60)
GLUCOSE: 108 mg/dL — AB (ref 65–99)
POTASSIUM: 3.2 mmol/L — AB (ref 3.5–5.1)
Sodium: 131 mmol/L — ABNORMAL LOW (ref 135–145)

## 2017-07-11 LAB — PHOSPHORUS: PHOSPHORUS: 2.6 mg/dL (ref 2.5–4.6)

## 2017-07-11 LAB — MAGNESIUM: Magnesium: 1.8 mg/dL (ref 1.7–2.4)

## 2017-07-11 MED ORDER — POTASSIUM CHLORIDE 10 MEQ/100ML IV SOLN
10.0000 meq | INTRAVENOUS | Status: AC
  Administered 2017-07-11 (×4): 10 meq via INTRAVENOUS
  Filled 2017-07-11 (×4): qty 100

## 2017-07-11 MED ORDER — INSULIN ASPART 100 UNIT/ML ~~LOC~~ SOLN: 0.0000 [IU] | SUBCUTANEOUS | Status: DC

## 2017-07-11 MED ORDER — TRACE MINERALS CR-CU-MN-SE-ZN 10-1000-500-60 MCG/ML IV SOLN
INTRAVENOUS | Status: DC
  Filled 2017-07-11: qty 960

## 2017-07-11 MED ORDER — SODIUM CHLORIDE 0.45 % IV SOLN
INTRAVENOUS | Status: DC
  Administered 2017-07-11 – 2017-07-13 (×5): via INTRAVENOUS

## 2017-07-11 MED ORDER — FAT EMULSION PLANT BASED 20 % IV EMUL
250.0000 mL | INTRAVENOUS | Status: DC
  Filled 2017-07-11: qty 250

## 2017-07-11 NOTE — Progress Notes (Signed)
Dr Everlene FarrierPabon had ordered for the patient to have a picc line placed and orders for TPN.  The patient told me shortly after these orders were entered that she was passing gas.  She said she passed gas 3 times.  Dr Everlene FarrierPabon requested that I clamp the NG tube.  Patient never complained of nausea.  She did say she was having some cramping and tried to use the bathroom but was only able to void.  Patient put out 200ml during the NG clamp trial.  It was decided to continue with the plan to place a picc line and start tpn feeding.  Waiting for the IV team to place picc line

## 2017-07-11 NOTE — Progress Notes (Signed)
Refusing PICC at this time. States wanting to speak with MD before having any further procedures. Marcella RN notified. IV/PICC Team to follow up on placement on 4-12.

## 2017-07-11 NOTE — Progress Notes (Signed)
Ileus Still cramping pain AVSS  Abd: incision c/d/i, drains in place serous  A/P TPN  PICC Keep NGT Mobilize Continue ABs

## 2017-07-11 NOTE — Progress Notes (Signed)
Dr pabon notified that the patient said she just recently passed gas 3 times.  It was painful when she passed the gas.  He said to clamp the NG tube for 4 hours.  If patient tolerates the clamp trial then nursing is to cancel the tpn/picc order.  Order received to change abdominal dressings

## 2017-07-11 NOTE — Progress Notes (Signed)
Dr. Excell Seltzerooper notified of patient complaint that...."something's wrong, it feels like the tube is pushing on my throat..it started around 4pm when I coughed hard.Marland Kitchen.Marland Kitchen.I want to see a doctor..." Acknowledged, Xray ordered to confirm NGT in stomach; Markings on NGT unchanged from postop position; Chloraceptic spray given; Windy Carinaurner,Taneesha Edgin K, RN

## 2017-07-11 NOTE — Progress Notes (Signed)
[facility] CLINIC INFECTIOUS DISEASE PROGRESS NOTE Date of Admission:  07/02/2017     ID: Donna Hammond is a 67 y.o. female with ruptured appendicitis  Active Problems:   Acute perforated appendicitis   Enteritis due to Clostridium difficile   Subjective: No fever, passed flatus  Still has NGT in place    ROS  Eleven systems are reviewed and negative except per hpi  Medications:  Antibiotics Given (last 72 hours)    Date/Time Action Medication Dose Rate   07/08/17 1501 Given   vancomycin (VANCOCIN) 50 mg/mL oral solution 125 mg 125 mg    07/08/17 1501 Given   metroNIDAZOLE (FLAGYL) tablet 500 mg 500 mg    07/08/17 1803 Given   vancomycin (VANCOCIN) 50 mg/mL oral solution 125 mg 125 mg    07/08/17 2113 Given   piperacillin-tazobactam (ZOSYN) IVPB 3.375 g 3.375 g    07/09/17 0524 New Bag/Given   piperacillin-tazobactam (ZOSYN) IVPB 3.375 g 3.375 g 12.5 mL/hr   07/09/17 0530 Given  [NGT clamped]   metroNIDAZOLE (FLAGYL) tablet 500 mg 500 mg    07/09/17 1027 Given   vancomycin (VANCOCIN) 50 mg/mL oral solution 125 mg 125 mg    07/09/17 1350 New Bag/Given   piperacillin-tazobactam (ZOSYN) IVPB 3.375 g 3.375 g 12.5 mL/hr   07/09/17 1505 Given   vancomycin (VANCOCIN) 50 mg/mL oral solution 125 mg 125 mg    07/09/17 1505 New Bag/Given   metroNIDAZOLE (FLAGYL) IVPB 500 mg 500 mg 100 mL/hr   07/09/17 1827 Given   vancomycin (VANCOCIN) 50 mg/mL oral solution 125 mg 125 mg    07/09/17 2202 New Bag/Given   piperacillin-tazobactam (ZOSYN) IVPB 3.375 g 3.375 g 12.5 mL/hr   07/10/17 0006 Given   vancomycin (VANCOCIN) 50 mg/mL oral solution 125 mg 125 mg    07/10/17 0559 New Bag/Given   piperacillin-tazobactam (ZOSYN) IVPB 3.375 g 3.375 g 12.5 mL/hr   07/10/17 1011 Given   vancomycin (VANCOCIN) 50 mg/mL oral solution 125 mg 125 mg    07/10/17 1423 Given   vancomycin (VANCOCIN) 50 mg/mL oral solution 125 mg 125 mg    07/10/17 1423 New Bag/Given   piperacillin-tazobactam (ZOSYN)  IVPB 3.375 g 3.375 g 12.5 mL/hr   07/10/17 1741 Given   vancomycin (VANCOCIN) 50 mg/mL oral solution 125 mg 125 mg    07/10/17 2100 New Bag/Given   piperacillin-tazobactam (ZOSYN) IVPB 3.375 g 3.375 g 12.5 mL/hr   07/11/17 0013 Given   vancomycin (VANCOCIN) 50 mg/mL oral solution 125 mg 125 mg    07/11/17 0532 New Bag/Given   piperacillin-tazobactam (ZOSYN) IVPB 3.375 g 3.375 g 12.5 mL/hr   07/11/17 1010 Given   vancomycin (VANCOCIN) 50 mg/mL oral solution 125 mg 125 mg      . enoxaparin (LOVENOX) injection  40 mg Subcutaneous Q24H  . insulin aspart  0-9 Units Subcutaneous Q4H  . pantoprazole (PROTONIX) IV  40 mg Intravenous QHS  . vancomycin  125 mg Oral QID    Objective: Vital signs in last 24 hours: Temp:  [98.1 F (36.7 C)-98.7 F (37.1 C)] 98.5 F (36.9 C) (04/11 0436) Pulse Rate:  [90-94] 92 (04/11 0436) Resp:  [16-18] 18 (04/11 0436) BP: (114-134)/(52-55) 134/55 (04/11 0436) SpO2:  [94 %-96 %] 94 % (04/11 0436) Constitutional: She is oriented to person, place, and time. sitting up in chair Mouth/Throat: Oropharynx is clear and moist. No oropharyngeal exudate. NGT in place with bilious drainage Cardiovascular: Normal rate, regular rhythm and normal heart sounds. Exam reveals no gallop  and no friction rub. .  Pulmonary/Chest: Effort normal and breath sounds normal. No respiratory distress. He has no wheezes.  Abdominal: Soft. midline incision covered with honeycomb dressing, jp drains with ss drianage LymphShe has no cervical adenopathy.  Neurological: She is alert and oriented to person, place, and time.   Lab Results Recent Labs    07/09/17 0504 07/10/17 0549 07/11/17 0456  WBC 21.6* 16.2*  --   HGB 10.9* 9.8*  --   HCT 32.1* 28.9*  --   NA 130* 130* 131*  K 3.9 3.3* 3.2*  CL 99* 99* 96*  CO2 25 27 29   BUN 7 7 <5*  CREATININE 0.39* 0.48 0.44    Microbiology: Results for orders placed or performed during the hospital encounter of 07/02/17  C difficile  quick scan w PCR reflex     Status: None   Collection Time: 07/05/17  2:02 PM  Result Value Ref Range Status   C Diff antigen NEGATIVE NEGATIVE Final   C Diff toxin NEGATIVE NEGATIVE Final   C Diff interpretation No C. difficile detected.  Final    Comment: Performed at [facility], [address] [place], [place] [address]  Aerobic/Anaerobic Culture (surgical/deep wound)     Status: None (Preliminary result)   Collection Time: 07/08/17  9:07 PM  Result Value Ref Range Status   Specimen Description   Final    WOUND Performed at [facility], [address] [place], [place] [address]    Special Requests   Final    NONE Performed at [facility], [address] [place], [place] [address]    Gram Stain   Final    FEW WBC PRESENT, PREDOMINANTLY PMN NO ORGANISMS SEEN    Culture   Final    NO GROWTH 2 DAYS NO ANAEROBES ISOLATED; CULTURE IN PROGRESS FOR 5 DAYS Performed at [facility], [address] [place], [place] [address]    Report Status PENDING  Incomplete    Studies/Results: Dg Abd Portable 1v  Result Date: 07/10/2017 CLINICAL DATA:  NG tube placement EXAM: PORTABLE ABDOMEN - 1 VIEW COMPARISON:  07/10/2017 FINDINGS: Small pleural effusions with left greater than right basilar airspace disease. Esophageal tube tip overlies the proximal stomach, side-port just beyond GE junction. Mildly dilated air-filled loops of small and large bowel in the upper abdomen. Residual enteral contrast. Surgical clips in the right hemiabdomen. Probable drain in the right lower quadrant IMPRESSION: 1. Esophageal tube tip overlies the proximal stomach 2. Small pleural effusions with left greater than right basilar airspace disease. Electronically Signed   By: Jasmine PangKim  Fujinaga M.D.   On: 07/10/2017 22:32   Dg Abd Portable 2v  Result Date: 07/10/2017 CLINICAL DATA:  illeus s/p gastrointestinal surgery on 07/08/17. Hx of barrett esophagus, diverticulosis. EXAM: PORTABLE  ABDOMEN - 2 VIEW COMPARISON:  CT abdomen dated 07/08/2017. FINDINGS: Enteric tube appears adequately positioned in the stomach. Overall bowel gas pattern is nonobstructive. Oral contrast is present within the small bowel of the LEFT lower quadrant. Drainage catheters are seen in the RIGHT abdomen and upper pelvis. IMPRESSION: Nonobstructive bowel gas pattern. Drainage catheters positioned in the RIGHT abdomen and pelvis. Enteric tube adequately positioned in the stomach. Electronically Signed   By: Bary RichardStan  Maynard M.D.   On: 07/10/2017 10:00   [place] Ekg Site Rite  Result Date: 07/11/2017 If Site Rite image not attached, placement could not be confirmed due to current cardiac rhythm.   Assessment/Plan: Donna Hammond is  a 67 y.o. female admitted with ruptured appendicitis following a recent dx of C diff being treated as otpt with vancomycin.  4/4 no fevers, wbc 9, pain a little better 4/9 - s/p surg 4/8 - 4/11- no fevers, passed flatus  Recommendations Appendicitis- cont zosyn  - cx pending but no growth to date on cx and seems to be improving. Can add antifungal if worsens.  C diff -diarrhea improved and fu C diff neg -  cont oral vanco. Will need 10 days more of oral vanco when she finishes her abx course for the appendicitis.   Thank you very much for the consult. Will follow with you.  Mick Sell   07/11/2017, 2:40 PM

## 2017-07-11 NOTE — Progress Notes (Signed)
Dr. Excell Seltzerooper notified of patient's request of not getting PICC and wanting tube out, because she's "passed gas and had a BM". Acknowledged,will reassess in am rounds;  not willing at this time to stop PICC placement or TPN or removal of NGT; Patient upset that she is "fullfilling the requirements of getting this tube out...but I'm still having the line put in and the tube isn't coming out...";   Windy Carinaurner,Karishma Unrein K, RN; 8:43 PM; 07/11/2017

## 2017-07-11 NOTE — Progress Notes (Signed)
Dr. Excell Seltzerooper notified of patient's request to see MD prior to PICC line placement; Patient' stated that she wanted to know "why the requirements to getting the NGT removed and not having the TPN/PICC have changed"; acknowledged; Windy Carinaurner,Aleksander Edmiston K, RN 07/11/2017

## 2017-07-11 NOTE — Progress Notes (Signed)
POD # 3 AVSS No flatus NGT 650cc bile  PE NAD ZOX:WRUEAVWUAbd:dressing intact, no infection, drain serous fluids. Penrose intact. No peritonitis  A/p ileus continue ngt, start TPN Continue a/bs, appreciate ID now on zosyn and PO vanc Mobilize D/w family in detail

## 2017-07-11 NOTE — Progress Notes (Signed)
PHARMACY - ADULT TOTAL PARENTERAL NUTRITION CONSULT NOTE   Pharmacy Consult for TPN Indication: inadequate oral intake > 7 days  Patient Measurements: Height: 5\' 2"  (157.5 cm) Weight: 124 lb 1.6 oz (56.3 kg) IBW/kg (Calculated) : 50.1 TPN AdjBW (KG): 56.3 Body mass index is 22.7 kg/m.   Assessment:  67 y/o F with perforated appendicitis s/p exlap.   Endo:  Insulin requirements in the past 24 hours:   Lytes: Sodium (mmol/L)  Date Value  07/11/2017 131 (L)   Potassium (mmol/L)  Date Value  07/11/2017 3.2 (L)   Magnesium (mg/dL)  Date Value  16/10/960404/01/2018 1.8   Phosphorus (mg/dL)  Date Value  54/09/811904/01/2018 2.6   Calcium (mg/dL)  Date Value  14/78/295604/01/2018 8.0 (L)   Albumin (g/dL)  Date Value  21/30/865704/05/2017 4.1     Goal TPN rate is 65 ml/hr (provides 1468 kCal)  Current Nutrition:   Plan:  Clinimix 5/15 E TPN at 6540mL/hr. Lipids 20% at 15 ml/hr x 12 hours Electrolytes in TPN:  Add MVI, trace elements, and thiamine to TPN Sensitive  q4h SSI and adjust as needed 1/2NS 125 ml/hr Monitor TPN labs daily F/U AM  Valentina GuChristy, Tymere Depuy D 07/11/2017,4:51 PM

## 2017-07-11 NOTE — Progress Notes (Signed)
Nutrition Follow Up Note   DOCUMENTATION CODES:   Not applicable  INTERVENTION:   Recommend Clinimix 5/15 with electrolytes at 62m/hr + 20% lipids _0 /hr x 12 hrs/day (Goal rate 629mhr once labs stable)  Regimen at goal provides 1468kcal/day, 78g/day protein, 174099molume  Add MVI daily   Add trace elements   Add IV thiamine 100m26mily x 3 days  Pt at high refeeding risk; recommend check P, K, and Mg daily for 3 days after TPN iniatition.   Daily weights  Per MD, keep IVF and TPN total rate at 125ml87mand remove dextrose.  NUTRITION DIAGNOSIS:   Inadequate oral intake related to acute illness as evidenced by NPO status. -continues   GOAL:   Patient will meet greater than or equal to 90% of their needs  -not met   MONITOR:   Diet advancement, Labs, Weight trends, I & O's, Other (Comment)(TPN)  ASSESSMENT:   66 y.27 female with acute perforated appendicitis with extensive free fluid without consolidated abscess, complicated by profuse watery diarrhea attributed to clostridium difficile and by pertinent comorbidities including HTN, emphysema, diverticulosis, and Barrett's esophagus. Pt now s/p ex lap with appendectomy 4/8   Pt without adequate oral intake for >7 days. Plan to initiate TPN. NGT in place; 650ml 33m hrs. Pt NPO. JP drains in place; 125ml x43mrs. No new weight since 4/2; daily weights. No flatus or BM. Pt at moderate feeding risk; recommend check K, Mg, and P.     Medications reviewed and include: lovenox, protonix, vancomycin, NaCl with 5% dextrose _1 /hr, zosyn, hydromorphone  Labs reviewed:  K 3.2(L), Na 131(L), Cl 96(L), BUN <5(L), Ca 8.0(L), P 2.6 wnl,  Mg 1.8 wnl Wbc- 16.2(H), Hgb 9.8(L), Hct 28.9(L) cbgs- 136, 128, 108 x 48hrs  Diet Order:  Diet NPO time specified Except for: Sips with Meds, Ice Chips  EDUCATION NEEDS:   Education needs have been addressed  Skin:  Skin Assessment: (incision abdomen )  Last BM:  4/8- type  6  Height:   Ht Readings from Last 1 Encounters:  07/02/17 _2  (1.575 m)    Weight:   Wt Readings from Last 1 Encounters:  07/02/17 124 lb 1.6 oz (56.3 kg)    Ideal Body Weight:  50 kg  BMI:  Body mass index is 22.7 kg/m.  Estimated Nutritional Needs:   Kcal:  1300-1500kcal/day   Protein:  68-79g/day   Fluid:  >1.4L/day   Ashten Prats CKoleen Distance, LDN Pager #- [phone] Pager: [phone]

## 2017-07-12 LAB — CBC
HCT: 33.6 % — ABNORMAL LOW (ref 35.0–47.0)
HEMOGLOBIN: 11.7 g/dL — AB (ref 12.0–16.0)
MCH: 30.6 pg (ref 26.0–34.0)
MCHC: 34.7 g/dL (ref 32.0–36.0)
MCV: 88 fL (ref 80.0–100.0)
Platelets: 682 10*3/uL — ABNORMAL HIGH (ref 150–440)
RBC: 3.82 MIL/uL (ref 3.80–5.20)
RDW: 14 % (ref 11.5–14.5)
WBC: 15.8 10*3/uL — AB (ref 3.6–11.0)

## 2017-07-12 LAB — COMPREHENSIVE METABOLIC PANEL
ALK PHOS: 43 U/L (ref 38–126)
ALT: 14 U/L (ref 14–54)
ANION GAP: 7 (ref 5–15)
AST: 19 U/L (ref 15–41)
Albumin: 2.2 g/dL — ABNORMAL LOW (ref 3.5–5.0)
BILIRUBIN TOTAL: 0.7 mg/dL (ref 0.3–1.2)
CALCIUM: 7.8 mg/dL — AB (ref 8.9–10.3)
CO2: 27 mmol/L (ref 22–32)
Chloride: 97 mmol/L — ABNORMAL LOW (ref 101–111)
Creatinine, Ser: 0.31 mg/dL — ABNORMAL LOW (ref 0.44–1.00)
GFR calc Af Amer: >60 mL/min (ref >60)
Glucose, Bld: 85 mg/dL (ref 65–99)
POTASSIUM: 3.1 mmol/L — AB (ref 3.5–5.1)
Sodium: 131 mmol/L — ABNORMAL LOW (ref 135–145)
TOTAL PROTEIN: 5.6 g/dL — AB (ref 6.5–8.1)

## 2017-07-12 LAB — DIFFERENTIAL
BASOS PCT: 0 %
Basophils Absolute: 0 10*3/uL (ref 0–0.1)
EOS PCT: 1 %
Eosinophils Absolute: 0.1 10*3/uL (ref 0–0.7)
Lymphocytes Relative: 8 %
Lymphs Abs: 1.2 10*3/uL (ref 1.0–3.6)
MONO ABS: 1 10*3/uL — AB (ref 0.2–0.9)
Monocytes Relative: 6 %
NEUTROS PCT: 85 %
Neutro Abs: 13.4 10*3/uL — ABNORMAL HIGH (ref 1.4–6.5)

## 2017-07-12 LAB — MAGNESIUM: MAGNESIUM: 1.9 mg/dL (ref 1.7–2.4)

## 2017-07-12 LAB — TRIGLYCERIDES: TRIGLYCERIDES: 110 mg/dL (ref <150)

## 2017-07-12 LAB — PHOSPHORUS: PHOSPHORUS: 3.4 mg/dL (ref 2.5–4.6)

## 2017-07-12 MED ORDER — THIAMINE HCL 100 MG/ML IJ SOLN
INTRAVENOUS | Status: DC
  Filled 2017-07-12: qty 960

## 2017-07-12 MED ORDER — POTASSIUM CHLORIDE 10 MEQ/100ML IV SOLN
10.0000 meq | INTRAVENOUS | Status: AC
  Administered 2017-07-12 (×4): 10 meq via INTRAVENOUS
  Filled 2017-07-12 (×2): qty 100

## 2017-07-12 MED ORDER — ACETAMINOPHEN 500 MG PO TABS
1000.0000 mg | ORAL_TABLET | Freq: Four times a day (QID) | ORAL | Status: DC
  Administered 2017-07-12 (×2): 1000 mg via ORAL
  Administered 2017-07-16: 500 mg via ORAL
  Filled 2017-07-12 (×8): qty 2

## 2017-07-12 MED ORDER — OXYCODONE HCL 5 MG PO TABS
5.0000 mg | ORAL_TABLET | ORAL | Status: DC | PRN
  Administered 2017-07-12 – 2017-07-17 (×15): 5 mg via ORAL
  Filled 2017-07-12 (×15): qty 1

## 2017-07-12 MED ORDER — FLUCONAZOLE IN SODIUM CHLORIDE 400-0.9 MG/200ML-% IV SOLN
400.0000 mg | INTRAVENOUS | Status: DC
  Administered 2017-07-13 – 2017-07-16 (×4): 400 mg via INTRAVENOUS
  Filled 2017-07-12 (×5): qty 200

## 2017-07-12 MED ORDER — FAT EMULSION PLANT BASED 20 % IV EMUL
250.0000 mL | INTRAVENOUS | Status: DC
  Filled 2017-07-12: qty 250

## 2017-07-12 MED ORDER — FLUCONAZOLE IN SODIUM CHLORIDE 400-0.9 MG/200ML-% IV SOLN
800.0000 mg | Freq: Once | INTRAVENOUS | Status: AC
  Administered 2017-07-12: 800 mg via INTRAVENOUS
  Filled 2017-07-12: qty 400

## 2017-07-12 NOTE — Progress Notes (Signed)
Pharmacy Antibiotic Note  Donna Hammond is a 67 y.o. female admitted on 07/02/2017 with intra-abdominal infection. Pharmacy has been consulted for Zosyn dosing. Patient is also on oral vancomycin for C diff and ID is adding fluconazole 4/12.    Plan: Will conitnue Zosyn 3.375g IV q8h (EI).   Will order fluconazole 800 mg iv once followed by 400 mg iv daily.    Height: 5\' 2"  (157.5 cm) Weight: 128 lb 1.4 oz (58.1 kg) IBW/kg (Calculated) : 50.1  Temp (24hrs), Avg:98.6 F (37 C), Min:98.3 F (36.8 C), Max:99.2 F (37.3 C)  Recent Labs  Lab 07/08/17 0952 07/09/17 0504 07/10/17 0549 07/11/17 0456 07/12/17 0717  WBC 17.6* 21.6* 16.2*  --  15.8*  CREATININE 0.44 0.39* 0.48 0.44 0.31*    Estimated Creatinine Clearance: 54 mL/min (A) (by C-G formula based on SCr of 0.31 mg/dL (L)).    Allergies  Allergen Reactions  . Levaquin [Levofloxacin In D5w] Swelling    tingling    Antimicrobials this admission: 4/2 cefepime >> once 4/2 metronidazole >> 4/9 4/2 oral vancomycin >> 4/2 Zosyn >> 4/12 fluconazole >>  Dose adjustments this admission:   Microbiology results:   Thank you for allowing pharmacy to be a part of this patient's care.  Luisa Harthristy, Jessi Jessop D, PharmD Clinical Pharmacist 07/12/2017 3:31 PM

## 2017-07-12 NOTE — Progress Notes (Signed)
[facility] Date of Admission:  07/02/2017     ID: Donna Hammond is a 67 y.o. female with ruptured appendicitis  Active Problems:   Acute perforated appendicitis   Enteritis due to Clostridium difficile   Subjective: Advancing diet, ngt removed and has had some clears. Still with abd pain with each passed flatus  ROS  Eleven systems are reviewed and negative except per hpi  Medications:  Antibiotics Given (last 72 hours)    Date/Time Action Medication Dose Rate   07/09/17 1505 Given   vancomycin (VANCOCIN) 50 mg/mL oral solution 125 mg 125 mg    07/09/17 1505 New Bag/Given   metroNIDAZOLE (FLAGYL) IVPB 500 mg 500 mg 100 mL/hr   07/09/17 1827 Given   vancomycin (VANCOCIN) 50 mg/mL oral solution 125 mg 125 mg    07/09/17 2202 New Bag/Given   piperacillin-tazobactam (ZOSYN) IVPB 3.375 g 3.375 g 12.5 mL/hr   07/10/17 0006 Given   vancomycin (VANCOCIN) 50 mg/mL oral solution 125 mg 125 mg    07/10/17 0559 New Bag/Given   piperacillin-tazobactam (ZOSYN) IVPB 3.375 g 3.375 g 12.5 mL/hr   07/10/17 1011 Given   vancomycin (VANCOCIN) 50 mg/mL oral solution 125 mg 125 mg    07/10/17 1423 Given   vancomycin (VANCOCIN) 50 mg/mL oral solution 125 mg 125 mg    07/10/17 1423 New Bag/Given   piperacillin-tazobactam (ZOSYN) IVPB 3.375 g 3.375 g 12.5 mL/hr   07/10/17 1741 Given   vancomycin (VANCOCIN) 50 mg/mL oral solution 125 mg 125 mg    07/10/17 2100 New Bag/Given   piperacillin-tazobactam (ZOSYN) IVPB 3.375 g 3.375 g 12.5 mL/hr   07/11/17 0013 Given   vancomycin (VANCOCIN) 50 mg/mL oral solution 125 mg 125 mg    07/11/17 0532 New Bag/Given   piperacillin-tazobactam (ZOSYN) IVPB 3.375 g 3.375 g 12.5 mL/hr   07/11/17 1010 Given   vancomycin (VANCOCIN) 50 mg/mL oral solution 125 mg 125 mg    07/11/17 1542 New Bag/Given  [potassium was infusing]   piperacillin-tazobactam (ZOSYN) IVPB 3.375 g 3.375 g 12.5 mL/hr   07/11/17 1543 Given   vancomycin  (VANCOCIN) 50 mg/mL oral solution 125 mg 125 mg    07/11/17 1859 Given   vancomycin (VANCOCIN) 50 mg/mL oral solution 125 mg 125 mg    07/11/17 2200 New Bag/Given   piperacillin-tazobactam (ZOSYN) IVPB 3.375 g 3.375 g 12.5 mL/hr   07/11/17 2203 Given   vancomycin (VANCOCIN) 50 mg/mL oral solution 125 mg 125 mg    07/12/17 0610 New Bag/Given   piperacillin-tazobactam (ZOSYN) IVPB 3.375 g 3.375 g 12.5 mL/hr   07/12/17 1002 Given   vancomycin (VANCOCIN) 50 mg/mL oral solution 125 mg 125 mg    07/12/17 1334 New Bag/Given   piperacillin-tazobactam (ZOSYN) IVPB 3.375 g 3.375 g 12.5 mL/hr     . acetaminophen  1,000 mg Oral Q6H  . enoxaparin (LOVENOX) injection  40 mg Subcutaneous Q24H  . pantoprazole (PROTONIX) IV  40 mg Intravenous QHS  . vancomycin  125 mg Oral QID    Objective: Vital signs in last 24 hours: Temp:  [98.3 F (36.8 C)-99.2 F (37.3 C)] 98.3 F (36.8 C) (04/12 1210) Pulse Rate:  [88-94] 94 (04/12 1210) Resp:  [18-19] 19 (04/12 1210) BP: (133-148)/(56-67) 145/56 (04/12 1210) SpO2:  [96 %-97 %] 97 % (04/12 1210) Weight:  [58.1 kg (128 lb 1.4 oz)-59.1 kg (130 lb 3.2 oz)] 58.1 kg (128 lb 1.4 oz) (04/12 0500) Constitutional: She is oriented to person, place, and  time. sitting up in chair Mouth/Throat: Oropharynx is clear and moist. No oropharyngeal exudate. NGT in place with bilious drainage Cardiovascular: Normal rate, regular rhythm and normal heart sounds. Exam reveals no gallop and no friction rub. .  Pulmonary/Chest: Effort normal and breath sounds normal. No respiratory distress. He has no wheezes.  Abdominal: Soft. midline incision covered with honeycomb dressing, jp drains with ss drianage LymphShe has no cervical adenopathy.  Neurological: She is alert and oriented to person, place, and time.   Lab Results Recent Labs    07/10/17 0549 07/11/17 0456 07/12/17 0717  WBC 16.2*  --  15.8*  HGB 9.8*  --  11.7*  HCT 28.9*  --  33.6*  NA 130* 131* 131*  K 3.3*  3.2* 3.1*  CL 99* 96* 97*  CO2 27 29 27   BUN 7 <5* <5*  CREATININE 0.48 0.44 0.31*    Microbiology: Results for orders placed or performed during the hospital encounter of 07/02/17  C difficile quick scan w PCR reflex     Status: None   Collection Time: 07/05/17  2:02 PM  Result Value Ref Range Status   C Diff antigen NEGATIVE NEGATIVE Final   C Diff toxin NEGATIVE NEGATIVE Final   C Diff interpretation No C. difficile detected.  Final    Comment: Performed at [facility], [address] [place], [place] [address]  Aerobic/Anaerobic Culture (surgical/deep wound)     Status: None (Preliminary result)   Collection Time: 07/08/17  9:07 PM  Result Value Ref Range Status   Specimen Description   Final    WOUND Performed at [facility], [address], [place], [place] [address]    Special Requests   Final    NONE Performed at [facility], [address] [place], [place] [address]    Gram Stain   Final    FEW WBC PRESENT, PREDOMINANTLY PMN NO ORGANISMS SEEN    Culture   Final    NO GROWTH 3 DAYS NO ANAEROBES ISOLATED; CULTURE IN PROGRESS FOR 5 DAYS Performed at [facility], [address], [place], [place] [address]    Report Status PENDING  Incomplete    Studies/Results: Dg Abd Portable 1v  Result Date: 07/10/2017 CLINICAL DATA:  NG tube placement EXAM: PORTABLE ABDOMEN - 1 VIEW COMPARISON:  07/10/2017 FINDINGS: Small pleural effusions with left greater than right basilar airspace disease. Esophageal tube tip overlies the proximal stomach, side-port just beyond GE junction. Mildly dilated air-filled loops of small and large bowel in the upper abdomen. Residual enteral contrast. Surgical clips in the right hemiabdomen. Probable drain in the right lower quadrant IMPRESSION: 1. Esophageal tube tip overlies the proximal stomach 2. Small pleural effusions with left greater than right basilar airspace disease. Electronically Signed   By: Jasmine Pang M.D.   On: 07/10/2017 22:32   [place] Ekg Site Rite  Result Date: 07/11/2017 If Site Rite image not attached, placement could not be confirmed due to current cardiac rhythm.   Assessment/Plan: Donna Hammond is a 67 y.o. female admitted with ruptured appendicitis following a recent dx of C diff being treated as otpt with vancomycin.  4/4 no fevers, wbc 9, pain a little better 4/9 - s/p surg 4/8 - 4/11- no fevers, passed flatus 4/12 - no fevers, advancing diet.   Recommendations Appendicitis- cont zosyn  - cx pending but no growth to date on cx and seems to be improving. - will add fluconazole today since wbc  remains elevated  C diff -diarrhea improved and fu C diff neg -  cont oral vanco. Will need 10 days more of oral vanco when she finishes her abx course for the appendicitis.   Thank you very much for the consult. Will follow with you.  Mick Sell   07/12/2017, 1:50 PM

## 2017-07-12 NOTE — Progress Notes (Signed)
Dr. Excell Seltzerooper notified of Pharmacist's recommendation of starting D5NS with 20meqKCL, since patient refused PICC and TPN not started; K+ 3.2 on am labs with subsequent runs of K+ given during the day; also defer TPN/fats orders until the am; acknowledged; no K+ added at this time, will continue with present IVF; will hold off on CBGs until after TPN started; will address issues in the am. Melika Reder K, RN,12:11 AM4/03/2018

## 2017-07-12 NOTE — Progress Notes (Signed)
Per Dr. Everlene FarrierPabon discontinue order for PICC line as pt is no longer receiving TPN.

## 2017-07-12 NOTE — Care Management Important Message (Signed)
Important Message  Patient Details  Name: Donna Hammond MRN: [id number] Date of Birth: 05/16/1950   Medicare Important Message Given:  Yes    Chapman FitchBOWEN, Donie Moulton T, RN 07/12/2017, 5:06 PM

## 2017-07-12 NOTE — Progress Notes (Signed)
POD # 4 Ileus improving had flatus and BM She refused PICC and TPN AVSS Creat ok Alb 2 Wbc 15 Drain 345  PE NAD Abd: incision w staples in place, Some drainage from penrose. JP w serous fluid Ext: well perfused  A/P Had a lengthy conversation w pt and family. She wishes to try clear liquids and hold off tpn and PICC We will dc ngt and do clear liquids HE understands that she may not be able to tolerate this and this would be a clear indication for TPN They also wanted me to transfer her to a facility near [place], I do not feel comfortable doing this over the weekend and think this may complicate her course.  I might entertain transfer next week. I did encourage Husband to find a Careers advisersurgeon and to tell me whic facility to send her to. HE will work on that today We continue a/bs and recheck labs in am

## 2017-07-13 LAB — BASIC METABOLIC PANEL
Anion gap: 4 — ABNORMAL LOW (ref 5–15)
CALCIUM: 7.7 mg/dL — AB (ref 8.9–10.3)
CHLORIDE: 99 mmol/L — AB (ref 101–111)
CO2: 28 mmol/L (ref 22–32)
Glucose, Bld: 64 mg/dL — ABNORMAL LOW (ref 65–99)
Potassium: 3.1 mmol/L — ABNORMAL LOW (ref 3.5–5.1)
SODIUM: 131 mmol/L — AB (ref 135–145)

## 2017-07-13 LAB — CBC
HCT: 27.9 % — ABNORMAL LOW (ref 35.0–47.0)
Hemoglobin: 10.3 g/dL — ABNORMAL LOW (ref 12.0–16.0)
MCH: 32.5 pg (ref 26.0–34.0)
MCHC: 36.9 g/dL — ABNORMAL HIGH (ref 32.0–36.0)
MCV: 87.9 fL (ref 80.0–100.0)
Platelets: 608 10*3/uL — ABNORMAL HIGH (ref 150–440)
RBC: 3.18 MIL/uL — AB (ref 3.80–5.20)
RDW: 14.1 % (ref 11.5–14.5)
WBC: 15.9 10*3/uL — ABNORMAL HIGH (ref 3.6–11.0)

## 2017-07-13 LAB — PHOSPHORUS: PHOSPHORUS: 3.1 mg/dL (ref 2.5–4.6)

## 2017-07-13 LAB — PREALBUMIN: PREALBUMIN: 12.6 mg/dL — AB (ref 18–38)

## 2017-07-13 LAB — MAGNESIUM: MAGNESIUM: 1.8 mg/dL (ref 1.7–2.4)

## 2017-07-13 LAB — GLUCOSE, CAPILLARY: Glucose-Capillary: 89 mg/dL (ref 65–99)

## 2017-07-13 MED ORDER — BOOST PLUS PO LIQD
237.0000 mL | Freq: Three times a day (TID) | ORAL | Status: DC
  Filled 2017-07-13: qty 237

## 2017-07-13 MED ORDER — ENSURE ENLIVE PO LIQD
237.0000 mL | Freq: Three times a day (TID) | ORAL | Status: DC
  Administered 2017-07-13 – 2017-07-17 (×12): 237 mL via ORAL

## 2017-07-13 MED ORDER — POTASSIUM CHLORIDE CRYS ER 20 MEQ PO TBCR
40.0000 meq | EXTENDED_RELEASE_TABLET | Freq: Two times a day (BID) | ORAL | Status: AC
  Administered 2017-07-13 – 2017-07-14 (×3): 40 meq via ORAL
  Filled 2017-07-13 (×3): qty 2

## 2017-07-13 NOTE — Progress Notes (Signed)
POD # 5 Ileus improving had flatus and BM AVSS Creat ok Wbc 15 Drain 250 serous Diflucan added by ID  PE NAD Abd: incision w staples in place, penrose remopved. JPs w serous fluid Ext: well perfused  A/P  Ileus staggering She is on a full liquids and tolerating some of this Replace K If WBC persists el;evated tomorrow I will re scan her to r/o abscess Add boost supplement Mobilize Continue A/Bs

## 2017-07-13 NOTE — Progress Notes (Addendum)
Per Dr. Everlene FarrierPabon okay to change supplement to ensure enlive which is stocked on the unit.

## 2017-07-14 LAB — AEROBIC/ANAEROBIC CULTURE W GRAM STAIN (SURGICAL/DEEP WOUND): Culture: NO GROWTH

## 2017-07-14 LAB — BASIC METABOLIC PANEL
ANION GAP: 5 (ref 5–15)
CHLORIDE: 97 mmol/L — AB (ref 101–111)
CO2: 29 mmol/L (ref 22–32)
Calcium: 8.3 mg/dL — ABNORMAL LOW (ref 8.9–10.3)
Creatinine, Ser: <0.3 mg/dL — ABNORMAL LOW (ref 0.44–1.00)
GLUCOSE: 87 mg/dL (ref 65–99)
POTASSIUM: 4.1 mmol/L (ref 3.5–5.1)
SODIUM: 131 mmol/L — AB (ref 135–145)

## 2017-07-14 LAB — MAGNESIUM: MAGNESIUM: 2 mg/dL (ref 1.7–2.4)

## 2017-07-14 LAB — AEROBIC/ANAEROBIC CULTURE (SURGICAL/DEEP WOUND)

## 2017-07-14 LAB — PHOSPHORUS: PHOSPHORUS: 3.3 mg/dL (ref 2.5–4.6)

## 2017-07-14 NOTE — Progress Notes (Signed)
POD # 6 Ileus improving had flatus and BM AVSS Creat ok Taking some full liquids but about 25%  PE NAD Abd: incision w staples in place, penrose remopved. JPs w serous fluid Ext: well perfused  A/P slowly improving Keep on full liquids Mobilize Labs in am to include CBC Continue A/B until improvement of clinical condition and  WBC

## 2017-07-15 LAB — CBC
HEMATOCRIT: 27.9 % — AB (ref 35.0–47.0)
HEMOGLOBIN: 9.5 g/dL — AB (ref 12.0–16.0)
MCH: 30 pg (ref 26.0–34.0)
MCHC: 34.2 g/dL (ref 32.0–36.0)
MCV: 87.7 fL (ref 80.0–100.0)
Platelets: 746 10*3/uL — ABNORMAL HIGH (ref 150–440)
RBC: 3.18 MIL/uL — ABNORMAL LOW (ref 3.80–5.20)
RDW: 13.6 % (ref 11.5–14.5)
WBC: 10.6 10*3/uL (ref 3.6–11.0)

## 2017-07-15 LAB — COMPREHENSIVE METABOLIC PANEL
ALBUMIN: 2.3 g/dL — AB (ref 3.5–5.0)
ALK PHOS: 44 U/L (ref 38–126)
ALT: 14 U/L (ref 14–54)
AST: 21 U/L (ref 15–41)
Anion gap: 6 (ref 5–15)
BUN: 6 mg/dL (ref 6–20)
CALCIUM: 8.1 mg/dL — AB (ref 8.9–10.3)
CHLORIDE: 97 mmol/L — AB (ref 101–111)
CO2: 28 mmol/L (ref 22–32)
CREATININE: 0.4 mg/dL — AB (ref 0.44–1.00)
GFR calc Af Amer: >60 mL/min (ref >60)
GFR calc non Af Amer: >60 mL/min (ref >60)
GLUCOSE: 94 mg/dL (ref 65–99)
Potassium: 3.9 mmol/L (ref 3.5–5.1)
Sodium: 131 mmol/L — ABNORMAL LOW (ref 135–145)
Total Bilirubin: 0.4 mg/dL (ref 0.3–1.2)
Total Protein: 5.8 g/dL — ABNORMAL LOW (ref 6.5–8.1)

## 2017-07-15 LAB — DIFFERENTIAL
BASOS ABS: 0 10*3/uL (ref 0–0.1)
Basophils Relative: 0 %
Eosinophils Absolute: 0.2 10*3/uL (ref 0–0.7)
Eosinophils Relative: 2 %
LYMPHS ABS: 1.9 10*3/uL (ref 1.0–3.6)
Lymphocytes Relative: 18 %
MONO ABS: 1.3 10*3/uL — AB (ref 0.2–0.9)
MONOS PCT: 12 %
NEUTROS ABS: 7.1 10*3/uL — AB (ref 1.4–6.5)
Neutrophils Relative %: 68 %

## 2017-07-15 LAB — MAGNESIUM: Magnesium: 1.9 mg/dL (ref 1.7–2.4)

## 2017-07-15 LAB — PREALBUMIN: Prealbumin: 12.6 mg/dL — ABNORMAL LOW (ref 18–38)

## 2017-07-15 LAB — TRIGLYCERIDES: Triglycerides: 102 mg/dL (ref <150)

## 2017-07-15 LAB — PHOSPHORUS: PHOSPHORUS: 3.9 mg/dL (ref 2.5–4.6)

## 2017-07-15 NOTE — Progress Notes (Addendum)
Nutrition Follow Up Note   DOCUMENTATION CODES:   Not applicable  INTERVENTION:   Ensure Enlive po TID, each supplement provides 350 kcal and 20 grams of protein  Recommend daily MVI  NUTRITION DIAGNOSIS:   Inadequate oral intake related to acute illness as evidenced by NPO status. -progressing   GOAL:   Patient will meet greater than or equal to 90% of their needs  -not met  MONITOR:   PO intake, Supplement acceptance, Labs, Weight trends, I & O's  ASSESSMENT:   67 y.o. female with acute perforated appendicitis with extensive free fluid without consolidated abscess, complicated by profuse watery diarrhea attributed to clostridium difficile and by pertinent comorbidities including HTN, emphysema, diverticulosis, and Barrett's esophagus. Pt now s/p ex lap with appendectomy 4/8   Pt initiated on clear liquid diet 4/12 and now advanced to full liquid diet. Per RN, pt tolerating full liquids and drinking some Ensure. BM noted 4/14. JP drain with 99m output x 24 hrs. Penrose drains removed. Per chart, pt with ~3lb wt loss since admit; RD will continue to monitor.   Medications reviewed and include: lovenox, metronidazole, protonix, vancomycin, NaCl with 5% dextrose @100ml /hr, zosyn, hydromorphone  Labs reviewed:  Na 131(L), K 3.9 wnl, creat 0.40(L), Ca 8.1(L) adj. 9.46 wnl, P 3.9 wnl, Mg 1.9 wnl, alb 2.3(L) Prealbumin- 12.6(L) Hgb 9.5(L), Hct 27.9(L)  Diet Order:  Diet full liquid Room service appropriate? Yes; Fluid consistency: Thin  EDUCATION NEEDS:   Education needs have been addressed  Skin:  Skin Assessment: (incision abdomen )  Last BM:  4/14  Height:   Ht Readings from Last 1 Encounters:  07/02/17 5' 2"  (1.575 m)    Weight:   Wt Readings from Last 1 Encounters:  07/15/17 121 lb 14.6 oz (55.3 kg)    Ideal Body Weight:  50 kg  BMI:  Body mass index is 22.3 kg/m.  Estimated Nutritional Needs:   Kcal:  1300-1500kcal/day   Protein:  68-79g/day    Fluid:  >1.4L/day   CKoleen DistanceMS, RD, LDN Pager #-[phone] Hours Pager: [phone]

## 2017-07-15 NOTE — Progress Notes (Signed)
[facility]ESS NOTE Date of Admission:  07/02/2017     ID: Donna Hammond is a 67 y.o. female with ruptured appendicitis  Active Problems:   Acute perforated appendicitis   Enteritis due to Clostridium difficile   Subjective: Advancing diet to full liquid, still some incisional pain and some crampy pain with flatus ROS  Eleven systems are reviewed and negative except per hpi  Medications:  Antibiotics Given (last 72 hours)    Date/Time Action Medication Dose Rate   07/12/17 1334 New Bag/Given   piperacillin-tazobactam (ZOSYN) IVPB 3.375 g 3.375 g 12.5 mL/hr   07/12/17 1710 Given   vancomycin (VANCOCIN) 50 mg/mL oral solution 125 mg 125 mg    07/12/17 2000 Given   vancomycin (VANCOCIN) 50 mg/mL oral solution 125 mg 125 mg    07/12/17 2240 New Bag/Given   piperacillin-tazobactam (ZOSYN) IVPB 3.375 g 3.375 g 12.5 mL/hr   07/13/17 0020 Given   vancomycin (VANCOCIN) 50 mg/mL oral solution 125 mg 125 mg    07/13/17 0603 New Bag/Given   piperacillin-tazobactam (ZOSYN) IVPB 3.375 g 3.375 g 12.5 mL/hr   07/13/17 0906 Given   vancomycin (VANCOCIN) 50 mg/mL oral solution 125 mg 125 mg    07/13/17 1454 Given   vancomycin (VANCOCIN) 50 mg/mL oral solution 125 mg 125 mg    07/13/17 1454 New Bag/Given   piperacillin-tazobactam (ZOSYN) IVPB 3.375 g 3.375 g 12.5 mL/hr   07/13/17 2053 New Bag/Given   piperacillin-tazobactam (ZOSYN) IVPB 3.375 g 3.375 g 12.5 mL/hr   07/13/17 2055 Given   vancomycin (VANCOCIN) 50 mg/mL oral solution 125 mg 125 mg    07/14/17 0542 Given   vancomycin (VANCOCIN) 50 mg/mL oral solution 125 mg 125 mg    07/14/17 0542 New Bag/Given   piperacillin-tazobactam (ZOSYN) IVPB 3.375 g 3.375 g 12.5 mL/hr   07/14/17 0916 Given   vancomycin (VANCOCIN) 50 mg/mL oral solution 125 mg 125 mg    07/14/17 1506 Given   vancomycin (VANCOCIN) 50 mg/mL oral solution 125 mg 125 mg    07/14/17 1507 New Bag/Given   piperacillin-tazobactam (ZOSYN) IVPB  3.375 g 3.375 g 12.5 mL/hr   07/14/17 2043 New Bag/Given   piperacillin-tazobactam (ZOSYN) IVPB 3.375 g 3.375 g 12.5 mL/hr   07/14/17 2110 Given   vancomycin (VANCOCIN) 50 mg/mL oral solution 125 mg 125 mg    07/15/17 0510 Given   vancomycin (VANCOCIN) 50 mg/mL oral solution 125 mg 125 mg    07/15/17 0511 New Bag/Given   piperacillin-tazobactam (ZOSYN) IVPB 3.375 g 3.375 g 12.5 mL/hr   07/15/17 0916 Given   vancomycin (VANCOCIN) 50 mg/mL oral solution 125 mg 125 mg      . acetaminophen  1,000 mg Oral Q6H  . enoxaparin (LOVENOX) injection  40 mg Subcutaneous Q24H  . feeding supplement (ENSURE ENLIVE)  237 mL Oral TID BM  . pantoprazole (PROTONIX) IV  40 mg Intravenous QHS  . vancomycin  125 mg Oral QID    Objective: Vital signs in last 24 hours: Temp:  [98 F (36.7 C)-98.6 F (37 C)] 98.3 F (36.8 C) (04/15 1216) Pulse Rate:  [80-89] 80 (04/15 1216) Resp:  [15-20] 20 (04/15 1216) BP: (105-145)/(57-60) 105/58 (04/15 1216) SpO2:  [95 %-99 %] 99 % (04/15 1216) Weight:  [55.3 kg (121 lb 14.6 oz)] 55.3 kg (121 lb 14.6 oz) (04/15 0514) Constitutional: She is oriented to person, place, and time. sitting up in chair Mouth/Throat: Oropharynx is clear and moist. No oropharyngeal exudate. NGT in place  with bilious drainage Cardiovascular: Normal rate, regular rhythm and normal heart sounds. Exam reveals no gallop and no friction rub. .  Pulmonary/Chest: Effort normal and breath sounds normal. No respiratory distress. He has no wheezes.  Abdominal: Soft. midline incision covered with honeycomb dressing, jp drains with ss drianage LymphShe has no cervical adenopathy.  Neurological: She is alert and oriented to person, place, and time.   Lab Results Recent Labs    07/13/17 0618 07/14/17 0633 07/15/17 0500  WBC 15.9*  --  10.6  HGB 10.3*  --  9.5*  HCT 27.9*  --  27.9*  NA 131* 131* 131*  K 3.1* 4.1 3.9  CL 99* 97* 97*  CO2 28 29 28   BUN <5* <5* 6  CREATININE <0.30* <0.30* 0.40*     Microbiology: Results for orders placed or performed during the hospital encounter of 07/02/17  C difficile quick scan w PCR reflex     Status: None   Collection Time: 07/05/17  2:02 PM  Result Value Ref Range Status   C Diff antigen NEGATIVE NEGATIVE Final   C Diff toxin NEGATIVE NEGATIVE Final   C Diff interpretation No C. difficile detected.  Final    Comment: Performed at [facility], [address] [place], [place] [address]  Aerobic/Anaerobic Culture (surgical/deep wound)     Status: None   Collection Time: 07/08/17  9:07 PM  Result Value Ref Range Status   Specimen Description   Final    WOUND Performed at [facility], [address], [place], [place] [address]    Special Requests   Final    NONE Performed at [facility], [address] [place], [place] [address]    Gram Stain   Final    FEW WBC PRESENT, PREDOMINANTLY PMN NO ORGANISMS SEEN    Culture   Final    No growth aerobically or anaerobically. Performed at [facility], [address], [place], [place] [address]    Report Status 07/14/2017 FINAL  Final    Studies/Results: No results found.  Assessment/Plan: Donna Hammond is a 67 y.o. female admitted with ruptured appendicitis following a recent dx of C diff being treated as otpt with vancomycin.  4/4 no fevers, wbc 9, pain a little better 4/9 - s/p surg 4/8 - 4/11- no fevers, passed flatus 4/12 - no fevers, advancing diet.  4/15- no fevers, wbc down to 10  Recommendations Appendicitis- cont zosyn  - cx  no growth to date  -Cont  fluconazole   C diff -diarrhea improved and fu C diff neg -  cont oral vanco. Will need 10 days more of oral vanco when she finishes her abx course for the appendicitis.   Thank you very much for the consult. Will follow with you.  Mick Sell   07/15/2017, 12:40 PM

## 2017-07-15 NOTE — Progress Notes (Addendum)
SURGICAL PROGRESS NOTE  Hospital Day(s): 13.   Post op day(s): 7 Days Post-Op.   Interval History: Patient seen and examined, no acute events or new complaints overnight. Patient reports her peri-incisional pain has been continuing to improve with tolerating increased clear liquids and TID oral nutritional supplement shakes, +flatus, and more formed less frequent BM's (3 yesterday), denies N/V, fever/chills, CP, or SOB. Patient also reports she's been ambulating around her room without difficulty, though has not ambulated in the halls due to c diff contact precautions.  Review of Systems:  Constitutional: denies fever, chills  HEENT: denies cough or congestion  Respiratory: denies any shortness of breath  Cardiovascular: denies chest pain or palpitations  Gastrointestinal: abdominal pain, N/V, and bowel function as per interval history Genitourinary: denies burning with urination or urinary frequency Musculoskeletal: denies pain, decreased motor or sensation Integumentary: denies any other rashes or skin discolorations except post-surgical abdominal wounds Neurological: denies HA or vision/hearing changes   Vital signs in last 24 hours: [min-max] current  Temp:  [98 F (36.7 C)-98.6 F (37 C)] 98.2 F (36.8 C) (04/15 0544) Pulse Rate:  [81-89] 85 (04/15 0544) Resp:  [15-20] 20 (04/15 0544) BP: (126-145)/(57-60) 136/60 (04/15 0544) SpO2:  [95 %-98 %] 95 % (04/15 0544) Weight:  [121 lb 14.6 oz (55.3 kg)] 121 lb 14.6 oz (55.3 kg) (04/15 0514)     Height: 5\' 2"  (157.5 cm) Weight: 121 lb 14.6 oz (55.3 kg) BMI (Calculated): 22.29   Intake/Output this shift:  No intake/output data recorded.   RLQ JP: 30 - 40 mL / 24 hrs LLQ JP: 5 - 10 mL / 24 hrs  Intake/Output last 2 shifts:  @IOLAST2SHIFTS @   Physical Exam:  Constitutional: alert, cooperative and no distress  HENT: normocephalic without obvious abnormality  Eyes: PERRL, EOM's grossly intact and symmetric  Neuro: CN II - XII  grossly intact and symmetric without deficit  Respiratory: breathing non-labored at rest  Cardiovascular: regular rate and sinus rhythm  Gastrointestinal: soft and non-distended with minimal peri-incisional tenderness to palpation, no peri-incisional erythema or drainage, post-surgical drains with LLQ >> RLQ serous drainage Musculoskeletal: UE and LE FROM, no edema or wounds, motor and sensation grossly intact, NT   Labs:  CBC Latest Ref Rng & Units 07/15/2017 07/13/2017 07/12/2017  WBC 3.6 - 11.0 K/uL 10.6 15.9(H) 15.8(H)  Hemoglobin 12.0 - 16.0 g/dL 1.6(X) 10.3(L) 11.7(L)  Hematocrit 35.0 - 47.0 % 27.9(L) 27.9(L) 33.6(L)  Platelets 150 - 440 K/uL 746(H) 608(H) 682(H)   CMP Latest Ref Rng & Units 07/15/2017 07/14/2017 07/13/2017  Glucose 65 - 99 mg/dL 94 87 09(U)  BUN 6 - 20 mg/dL 6 <0(A) <5(W)  Creatinine 0.44 - 1.00 mg/dL 0.98(J) <1.91(Y) <7.82(N)  Sodium 135 - 145 mmol/L 131(L) 131(L) 131(L)  Potassium 3.5 - 5.1 mmol/L 3.9 4.1 3.1(L)  Chloride 101 - 111 mmol/L 97(L) 97(L) 99(L)  CO2 22 - 32 mmol/L 28 29 28   Calcium 8.9 - 10.3 mg/dL 8.1(L) 8.3(L) 7.7(L)  Total Protein 6.5 - 8.1 g/dL 5.6(O) - -  Total Bilirubin 0.3 - 1.2 mg/dL 0.4 - -  Alkaline Phos 38 - 126 U/L 44 - -  AST 15 - 41 U/L 21 - -  ALT 14 - 54 U/L 14 - -   Imaging studies: No new pertinent imaging studies   Assessment/Plan: (ICD-10's: K35.33,A04.72) 67 y.o. female with resolving post-surgical ileus 7 Days Post-Op s/p exploratory laparotomy with appendectomy and drainage of pelvic and RUQ abscesses for acute perforated appendicitis with  extensive free fluid without consolidated abscess, complicated byprofuse watery diarrhea attributed to clostridium difficile, acute caloric malnutrition, and bypertinent comorbidities includingHTN, emphysema, diverticulosis, and Barrett's esophagus.   - pain control prn  - advance to soft diet with oral nutritional supplements   - continue Zosyn at least until discharge and 10 additional  days oral vancomycin and fluconazole  - continue to monitor abdominal exam and bowel function  - DVT prophylaxis, ambulation encouraged  - anticipate discharge planning  All of the above findings and recommendations were discussed with the patient, patient's family, and patient's RN, and all of patient's and family's questions were answered to their expressed satisfaction.  -- Scherrie GerlachJason E. Earlene Plateravis, MD, RPVI [place]: [facility] - Partnering for exceptional care. Office: [phone]

## 2017-07-15 NOTE — Progress Notes (Signed)
Pharmacy Antibiotic Note  Corena Herterlicia Risenhoover is a 67 y.o. female admitted on 07/02/2017 with intra-abdominal infection. Pharmacy has been consulted for Zosyn dosing. Patient is also on oral vancomycin for C diff and ID is adding fluconazole 4/12.    Plan: Will continue Zosyn 3.375g IV q8h (EI).   Will order fluconazole 800 mg iv once followed by 400 mg iv daily.    Height: 5\' 2"  (157.5 cm) Weight: 121 lb 14.6 oz (55.3 kg) IBW/kg (Calculated) : 50.1  Temp (24hrs), Avg:98.3 F (36.8 C), Min:98 F (36.7 C), Max:98.6 F (37 C)  Recent Labs  Lab 07/09/17 0504 07/10/17 0549 07/11/17 0456 07/12/17 0717 07/13/17 0618 07/14/17 0633 07/15/17 0500  WBC 21.6* 16.2*  --  15.8* 15.9*  --  10.6  CREATININE 0.39* 0.48 0.44 0.31* <0.30* <0.30* 0.40*    Estimated Creatinine Clearance: 54 mL/min (A) (by C-G formula based on SCr of 0.4 mg/dL (L)).    Allergies  Allergen Reactions  . Levaquin [Levofloxacin In D5w] Swelling    tingling    Antimicrobials this admission: 4/2 cefepime >> once 4/2 metronidazole >> 4/9 4/2 oral vancomycin >> 4/2 Zosyn >> 4/12 fluconazole >>  Dose adjustments this admission:   Microbiology results:   Thank you for allowing pharmacy to be a part of this patient's care.  Angelique BlonderMerrill,Elizebeth Kluesner A, PharmD Clinical Pharmacist 07/15/2017 12:44 PM

## 2017-07-16 MED ORDER — PANTOPRAZOLE SODIUM 40 MG PO TBEC
40.0000 mg | DELAYED_RELEASE_TABLET | Freq: Every day | ORAL | Status: DC
  Administered 2017-07-16 – 2017-07-17 (×2): 40 mg via ORAL
  Filled 2017-07-16 (×2): qty 1

## 2017-07-16 NOTE — Progress Notes (Signed)
[facility] Date of Admission:  07/02/2017     ID: Donna Hammond is a 67 y.o. female with ruptured appendicitis  Active Problems:   Acute perforated appendicitis   Enteritis due to Clostridium difficile   Subjective: Advancing diet, no fevers ROS  Eleven systems are reviewed and negative except per hpi  Medications:  Antibiotics Given (last 72 hours)    Date/Time Action Medication Dose Rate   07/13/17 1454 Given   vancomycin (VANCOCIN) 50 mg/mL oral solution 125 mg 125 mg    07/13/17 1454 New Bag/Given   piperacillin-tazobactam (ZOSYN) IVPB 3.375 g 3.375 g 12.5 mL/hr   07/13/17 2053 New Bag/Given   piperacillin-tazobactam (ZOSYN) IVPB 3.375 g 3.375 g 12.5 mL/hr   07/13/17 2055 Given   vancomycin (VANCOCIN) 50 mg/mL oral solution 125 mg 125 mg    07/14/17 0542 Given   vancomycin (VANCOCIN) 50 mg/mL oral solution 125 mg 125 mg    07/14/17 0542 New Bag/Given   piperacillin-tazobactam (ZOSYN) IVPB 3.375 g 3.375 g 12.5 mL/hr   07/14/17 0916 Given   vancomycin (VANCOCIN) 50 mg/mL oral solution 125 mg 125 mg    07/14/17 1506 Given   vancomycin (VANCOCIN) 50 mg/mL oral solution 125 mg 125 mg    07/14/17 1507 New Bag/Given   piperacillin-tazobactam (ZOSYN) IVPB 3.375 g 3.375 g 12.5 mL/hr   07/14/17 2043 New Bag/Given   piperacillin-tazobactam (ZOSYN) IVPB 3.375 g 3.375 g 12.5 mL/hr   07/14/17 2110 Given   vancomycin (VANCOCIN) 50 mg/mL oral solution 125 mg 125 mg    07/15/17 0510 Given   vancomycin (VANCOCIN) 50 mg/mL oral solution 125 mg 125 mg    07/15/17 0511 New Bag/Given   piperacillin-tazobactam (ZOSYN) IVPB 3.375 g 3.375 g 12.5 mL/hr   07/15/17 0916 Given   vancomycin (VANCOCIN) 50 mg/mL oral solution 125 mg 125 mg    07/15/17 1422 New Bag/Given   piperacillin-tazobactam (ZOSYN) IVPB 3.375 g 3.375 g 12.5 mL/hr   07/15/17 1428 Given   vancomycin (VANCOCIN) 50 mg/mL oral solution 125 mg 125 mg    07/15/17 1816 Given   vancomycin  (VANCOCIN) 50 mg/mL oral solution 125 mg 125 mg    07/15/17 2037 New Bag/Given   piperacillin-tazobactam (ZOSYN) IVPB 3.375 g 3.375 g 12.5 mL/hr   07/15/17 2322 Given   vancomycin (VANCOCIN) 50 mg/mL oral solution 125 mg 125 mg    07/16/17 0607 New Bag/Given   piperacillin-tazobactam (ZOSYN) IVPB 3.375 g 3.375 g 12.5 mL/hr   07/16/17 1034 Given   vancomycin (VANCOCIN) 50 mg/mL oral solution 125 mg 125 mg      . acetaminophen  1,000 mg Oral Q6H  . enoxaparin (LOVENOX) injection  40 mg Subcutaneous Q24H  . feeding supplement (ENSURE ENLIVE)  237 mL Oral TID BM  . pantoprazole (PROTONIX) IV  40 mg Intravenous QHS  . vancomycin  125 mg Oral QID    Objective: Vital signs in last 24 hours: Temp:  [98.3 F (36.8 C)] 98.3 F (36.8 C) (04/16 0553) Pulse Rate:  [83-84] 84 (04/16 0553) Resp:  [18-19] 18 (04/16 0553) BP: (120-135)/(49-63) 135/63 (04/16 0553) SpO2:  [94 %-98 %] 94 % (04/16 0553) Weight:  [54.7 kg (120 lb 9.6 oz)] 54.7 kg (120 lb 9.6 oz) (04/16 0500) Constitutional: She is oriented to person, place, and time. sitting up in chair Mouth/Throat: Oropharynx is clear and moist. No oropharyngeal exudate.Cardiovascular: Normal rate, regular rhythm and normal heart sounds. Exam reveals no gallop and no friction rub. .Marland Kitchen  Pulmonary/Chest: Effort normal and breath sounds normal. No respiratory distress. He has no wheezes.  Abdominal: Soft. midline incision covered with honeycomb dressing, jp drains with ss drianage LymphShe has no cervical adenopathy.  Neurological: She is alert and oriented to person, place, and time.   Lab Results Recent Labs    07/14/17 0633 07/15/17 0500  WBC  --  10.6  HGB  --  9.5*  HCT  --  27.9*  NA 131* 131*  K 4.1 3.9  CL 97* 97*  CO2 29 28  BUN <5* 6  CREATININE <0.30* 0.40*    Microbiology: Results for orders placed or performed during the hospital encounter of 07/02/17  C difficile quick scan w PCR reflex     Status: None   Collection Time:  07/05/17  2:02 PM  Result Value Ref Range Status   C Diff antigen NEGATIVE NEGATIVE Final   C Diff toxin NEGATIVE NEGATIVE Final   C Diff interpretation No C. difficile detected.  Final    Comment: Performed at [facility], [address] [place], [place] [address]  Aerobic/Anaerobic Culture (surgical/deep wound)     Status: None   Collection Time: 07/08/17  9:07 PM  Result Value Ref Range Status   Specimen Description   Final    WOUND Performed at [facility], [address], [place], [place] [address]    Special Requests   Final    NONE Performed at [facility], [address] [place], [place] [address]    Gram Stain   Final    FEW WBC PRESENT, PREDOMINANTLY PMN NO ORGANISMS SEEN    Culture   Final    No growth aerobically or anaerobically. Performed at [facility], [address], [place], [place] [address]    Report Status 07/14/2017 FINAL  Final    Studies/Results: No results found.  Assessment/Plan: Donna Hammond is a 67 y.o. female admitted with ruptured appendicitis following a recent dx of C diff being treated as otpt with vancomycin.  4/4 no fevers, wbc 9, pain a little better 4/9 - s/p surg 4/8 - 4/11- no fevers, passed flatus 4/12 - no fevers, advancing diet.  4/15- no fevers, wbc down to 10  4/16 - better day by day Recommendations Appendicitis- cont zosyn  - cx  no growth to date  -Cont  fluconazole  I would rec a total 14 days of abx therapy following  surgery given the severity of the infection and the duration of rupture prior to surgery. Stop date would be 4/22 but when ready for dc can send on oral augmentin  C diff -diarrhea improved and fu C diff neg -  cont oral vanco. Will need 10 days more of oral vanco when she finishes her abx course for the appendicitis.   Thank you very much for the consult. Will follow with you.  Mick Sell   07/16/2017, 2:03 PM

## 2017-07-16 NOTE — Progress Notes (Signed)
[facility] INF[facility] PROGRESS NOTE Date of Admission:  07/02/2017     ID: Donna Hammond is a 67 y.o. female with ruptured appendicitis  Active Problems:   Acute perforated appendicitis   Enteritis due to Clostridium difficile   Subjective: Advancing diet to full liquid, still some incisional pain and some crampy pain with flatus ROS  Eleven systems are reviewed and negative except per hpi  Medications:  Antibiotics Given (last 72 hours)    Date/Time Action Medication Dose Rate   07/13/17 1454 Given   vancomycin (VANCOCIN) 50 mg/mL oral solution 125 mg 125 mg    07/13/17 1454 New Bag/Given   piperacillin-tazobactam (ZOSYN) IVPB 3.375 g 3.375 g 12.5 mL/hr   07/13/17 2053 New Bag/Given   piperacillin-tazobactam (ZOSYN) IVPB 3.375 g 3.375 g 12.5 mL/hr   07/13/17 2055 Given   vancomycin (VANCOCIN) 50 mg/mL oral solution 125 mg 125 mg    07/14/17 0542 Given   vancomycin (VANCOCIN) 50 mg/mL oral solution 125 mg 125 mg    07/14/17 0542 New Bag/Given   piperacillin-tazobactam (ZOSYN) IVPB 3.375 g 3.375 g 12.5 mL/hr   07/14/17 0916 Given   vancomycin (VANCOCIN) 50 mg/mL oral solution 125 mg 125 mg    07/14/17 1506 Given   vancomycin (VANCOCIN) 50 mg/mL oral solution 125 mg 125 mg    07/14/17 1507 New Bag/Given   piperacillin-tazobactam (ZOSYN) IVPB 3.375 g 3.375 g 12.5 mL/hr   07/14/17 2043 New Bag/Given   piperacillin-tazobactam (ZOSYN) IVPB 3.375 g 3.375 g 12.5 mL/hr   07/14/17 2110 Given   vancomycin (VANCOCIN) 50 mg/mL oral solution 125 mg 125 mg    07/15/17 0510 Given   vancomycin (VANCOCIN) 50 mg/mL oral solution 125 mg 125 mg    07/15/17 0511 New Bag/Given   piperacillin-tazobactam (ZOSYN) IVPB 3.375 g 3.375 g 12.5 mL/hr   07/15/17 0916 Given   vancomycin (VANCOCIN) 50 mg/mL oral solution 125 mg 125 mg    07/15/17 1422 New Bag/Given   piperacillin-tazobactam (ZOSYN) IVPB 3.375 g 3.375 g 12.5 mL/hr   07/15/17 1428 Given   vancomycin (VANCOCIN) 50 mg/mL oral  solution 125 mg 125 mg    07/15/17 1816 Given   vancomycin (VANCOCIN) 50 mg/mL oral solution 125 mg 125 mg    07/15/17 2037 New Bag/Given   piperacillin-tazobactam (ZOSYN) IVPB 3.375 g 3.375 g 12.5 mL/hr   07/15/17 2322 Given   vancomycin (VANCOCIN) 50 mg/mL oral solution 125 mg 125 mg    07/16/17 0607 New Bag/Given   piperacillin-tazobactam (ZOSYN) IVPB 3.375 g 3.375 g 12.5 mL/hr   07/16/17 1034 Given   vancomycin (VANCOCIN) 50 mg/mL oral solution 125 mg 125 mg      . acetaminophen  1,000 mg Oral Q6H  . enoxaparin (LOVENOX) injection  40 mg Subcutaneous Q24H  . feeding supplement (ENSURE ENLIVE)  237 mL Oral TID BM  . pantoprazole (PROTONIX) IV  40 mg Intravenous QHS  . vancomycin  125 mg Oral QID    Objective: Vital signs in last 24 hours: Temp:  [98.3 F (36.8 C)] 98.3 F (36.8 C) (04/16 0553) Pulse Rate:  [83-84] 84 (04/16 0553) Resp:  [18-19] 18 (04/16 0553) BP: (120-135)/(49-63) 135/63 (04/16 0553) SpO2:  [94 %-98 %] 94 % (04/16 0553) Weight:  [54.7 kg (120 lb 9.6 oz)] 54.7 kg (120 lb 9.6 oz) (04/16 0500) Constitutional: She is oriented to person, place, and time. sitting up in chair Mouth/Throat: Oropharynx is clear and moist. No oropharyngeal exudate. NGT in place with bilious drainage  Cardiovascular: Normal rate, regular rhythm and normal heart sounds. Exam reveals no gallop and no friction rub. .  Pulmonary/Chest: Effort normal and breath sounds normal. No respiratory distress. He has no wheezes.  Abdominal: Soft. midline incision covered with honeycomb dressing, jp drains with ss drianage LymphShe has no cervical adenopathy.  Neurological: She is alert and oriented to person, place, and time.   Lab Results Recent Labs    07/14/17 0633 07/15/17 0500  WBC  --  10.6  HGB  --  9.5*  HCT  --  27.9*  NA 131* 131*  K 4.1 3.9  CL 97* 97*  CO2 29 28  BUN <5* 6  CREATININE <0.30* 0.40*    Microbiology: Results for orders placed or performed during the hospital  encounter of 07/02/17  C difficile quick scan w PCR reflex     Status: None   Collection Time: 07/05/17  2:02 PM  Result Value Ref Range Status   C Diff antigen NEGATIVE NEGATIVE Final   C Diff toxin NEGATIVE NEGATIVE Final   C Diff interpretation No C. difficile detected.  Final    Comment: Performed at [facility], [address] [place], [place] [address]  Aerobic/Anaerobic Culture (surgical/deep wound)     Status: None   Collection Time: 07/08/17  9:07 PM  Result Value Ref Range Status   Specimen Description   Final    WOUND Performed at [facility], [address] [place], [place] [address]    Special Requests   Final    NONE Performed at [facility], [address] [place], [place] [address]    Gram Stain   Final    FEW WBC PRESENT, PREDOMINANTLY PMN NO ORGANISMS SEEN    Culture   Final    No growth aerobically or anaerobically. Performed at [facility], [address] [place], [place] [address]    Report Status 07/14/2017 FINAL  Final    Studies/Results: No results found.  Assessment/Plan: Corena Herterlicia Tien is a 67 y.o. female admitted with ruptured appendicitis following a recent dx of C diff being treated as otpt with vancomycin.  4/4 no fevers, wbc 9, pain a little better 4/9 - s/p surg 4/8 - 4/11- no fevers, passed flatus 4/12 - no fevers, advancing diet.  4/15- no fevers, wbc down to 10  Recommendations Appendicitis- cont zosyn  - cx  no growth to date  -Cont  fluconazole   C diff -diarrhea improved and fu C diff neg -  cont oral vanco. Will need 10 days more of oral vanco when she finishes her abx course for the appendicitis.   Thank you very much for the consult. Will follow with you.  Mick SellDavid P Mykira Hofmeister   07/16/2017, 1:31 PM

## 2017-07-16 NOTE — Progress Notes (Addendum)
SURGICAL PROGRESS NOTE  Hospital Day(s): 14.   Post op day(s): 8 Days Post-Op.   Interval History: Patient seen and examined, no acute events or new complaints overnight. Patient reports she continues to slowly feel better each day, now tolerating soft/regular diet with +flatus and 3 somewhat more formed (still soft/loose) BM's per day, denies N/V, fever/chills, CP, or SOB, and today she ambulated twice around nursing station.  Review of Systems:  Constitutional: denies fever, chills  HEENT: denies cough or congestion  Respiratory: denies any shortness of breath  Cardiovascular: denies chest pain or palpitations  Gastrointestinal: abdominal pain, N/V, and bowel function as per interval history Genitourinary: denies burning with urination or urinary frequency Musculoskeletal: denies pain, decreased motor or sensation Integumentary: denies any other rashes or skin discolorations except post-surgical abdominal wounds Neurological: denies HA or vision/hearing changes   Vital signs in last 24 hours: [min-max] current  Temp:  [97.4 F (36.3 C)-98.3 F (36.8 C)] 97.4 F (36.3 C) (04/16 1415) Pulse Rate:  [82-84] 82 (04/16 1415) Resp:  [18] 18 (04/16 0553) BP: (114-135)/(50-63) 114/50 (04/16 1415) SpO2:  [94 %-100 %] 100 % (04/16 1415) Weight:  [120 lb 9.6 oz (54.7 kg)] 120 lb 9.6 oz (54.7 kg) (04/16 0500)     Height: 5\' 2"  (157.5 cm) Weight: 120 lb 9.6 oz (54.7 kg) BMI (Calculated): 22.05   Intake/Output this shift:  No intake/output data recorded.   Intake/Output last 2 shifts:  @IOLAST2SHIFTS @   Physical Exam:  Constitutional: alert, cooperative and no distress  HENT: normocephalic without obvious abnormality  Eyes: PERRL, EOM's grossly intact and symmetric  Neuro: CN II - XII grossly intact and symmetric without deficit  Respiratory: breathing non-labored at rest  Cardiovascular: regular rate and sinus rhythm  Gastrointestinal: soft, non-tender, and non-distended with <5 mL  pale clear yellow fluid from RLQ drain and <20 mL clear pale yellow fluid from LLQ drain, both well-secured without any surrounding erythema or purulent drainage, a few loose staples with surrounding excoriation at suprapubic fold, no purulent drainage after few staples removed Musculoskeletal: UE and LE FROM, no edema or wounds, motor and sensation grossly intact, NT   Labs:  CBC Latest Ref Rng & Units 07/15/2017 07/13/2017 07/12/2017  WBC 3.6 - 11.0 K/uL 10.6 15.9(H) 15.8(H)  Hemoglobin 12.0 - 16.0 g/dL 1.6(X9.5(L) 10.3(L) 11.7(L)  Hematocrit 35.0 - 47.0 % 27.9(L) 27.9(L) 33.6(L)  Platelets 150 - 440 K/uL 746(H) 608(H) 682(H)   CMP Latest Ref Rng & Units 07/15/2017 07/14/2017 07/13/2017  Glucose 65 - 99 mg/dL 94 87 09(U64(L)  BUN 6 - 20 mg/dL 6 <0(A<5(L) <5(W<5(L)  Creatinine 0.44 - 1.00 mg/dL 0.98(J0.40(L) <1.91(Y<0.30(L) <7.82(N<0.30(L)  Sodium 135 - 145 mmol/L 131(L) 131(L) 131(L)  Potassium 3.5 - 5.1 mmol/L 3.9 4.1 3.1(L)  Chloride 101 - 111 mmol/L 97(L) 97(L) 99(L)  CO2 22 - 32 mmol/L 28 29 28   Calcium 8.9 - 10.3 mg/dL 8.1(L) 8.3(L) 7.7(L)  Total Protein 6.5 - 8.1 g/dL 5.6(O5.8(L) - -  Total Bilirubin 0.3 - 1.2 mg/dL 0.4 - -  Alkaline Phos 38 - 126 U/L 44 - -  AST 15 - 41 U/L 21 - -  ALT 14 - 54 U/L 14 - -   Imaging studies: No new pertinent imaging studies   Assessment/Plan: (ICD-10's: K35.33,A04.72) 67 y.o. female with resolving post-surgical ileus 7 Days Post-Op s/p exploratory laparotomy with appendectomy and drainage of pelvic and RUQ abscesses for acute perforated appendicitis with extensive free fluid without consolidated abscess, complicated byprofuse watery diarrhea attributed  to clostridium difficile, acute caloric malnutrition, and bypertinent comorbidities includingHTN, emphysema, diverticulosis, and Barrett's esophagus.              - pain control prn, monitor abdominal exam and bowel function             - soft diet with oral nutritional supplements, gradually advance diet as per patient              -  will plan for discharge home tomorrow with Augmentin through 4/22 and oral vancomycin through 5/2  - RLQ drain removed, gauze placed, a few loose staples removed at incision base for excoriation without purulent fluid             - will require drain care teaching for LLQ post-surgical drain prior to discharge home  - infectious disease antibiotic recommendations appreciated  - will check one more CBC for WBC prior to discharge             - DVT prophylaxis, ambulation encouraged             - anticipate discharge home tomorrow  All of the above findings and recommendations were discussed with the patient, and all of patient's questions were answered to her expressed satisfaction.  -- Scherrie Gerlach Earlene Plater, MD, RPVI [place]: [facility] - Partnering for exceptional care. Office: [phone]

## 2017-07-17 ENCOUNTER — Telehealth: Payer: Self-pay

## 2017-07-17 ENCOUNTER — Telehealth: Payer: Self-pay | Admitting: General Practice

## 2017-07-17 DIAGNOSIS — K5792 Diverticulitis of intestine, part unspecified, without perforation or abscess without bleeding: Secondary | ICD-10-CM

## 2017-07-17 LAB — CBC
HCT: 25.6 % — ABNORMAL LOW (ref 35.0–47.0)
Hemoglobin: 9.5 g/dL — ABNORMAL LOW (ref 12.0–16.0)
MCH: 32.3 pg (ref 26.0–34.0)
MCHC: 37 g/dL — ABNORMAL HIGH (ref 32.0–36.0)
MCV: 87.3 fL (ref 80.0–100.0)
PLATELETS: 652 10*3/uL — AB (ref 150–440)
RBC: 2.94 MIL/uL — AB (ref 3.80–5.20)
RDW: 13.9 % (ref 11.5–14.5)
WBC: 8 10*3/uL (ref 3.6–11.0)

## 2017-07-17 MED ORDER — ENSURE ENLIVE PO LIQD: 237.0000 mL | Freq: Three times a day (TID) | ORAL | 12 refills | Status: AC

## 2017-07-17 MED ORDER — OXYCODONE HCL 5 MG PO TABS: 5.0000 mg | ORAL_TABLET | ORAL | 0 refills | Status: DC | PRN

## 2017-07-17 MED ORDER — METRONIDAZOLE 500 MG PO TABS: 500.0000 mg | ORAL_TABLET | Freq: Four times a day (QID) | ORAL | 0 refills | Status: AC

## 2017-07-17 MED ORDER — CIPROFLOXACIN HCL 500 MG PO TABS: 500.0000 mg | ORAL_TABLET | Freq: Two times a day (BID) | ORAL | 0 refills | Status: AC

## 2017-07-17 MED ORDER — VANCOMYCIN HCL 125 MG PO CAPS: 125.0000 mg | ORAL_CAPSULE | Freq: Four times a day (QID) | ORAL | 0 refills | Status: DC

## 2017-07-17 NOTE — Progress Notes (Signed)
IV was removed. Discharge instructions, follow-up appointments, and prescriptions were provided to the pt and husband at bedside. The pt was taken downstairs via wheelchair by NT.  

## 2017-07-17 NOTE — Discharge Instructions (Addendum)
In addition to general instructions for Open Appendectomy and C Diff infection,  Diet: Gradually resume home heart healthy diet.   Activity: No heavy lifting >15 - 20 pounds (children, pets, laundry, garbage) or strenuous activity until follow-up, but light activity and walking are encouraged. Do not drive or drink alcohol if taking narcotic pain medications.  Wound care: You may shower/get incision wet with soapy water and pat dry (do not rub incisions), but no baths or submerging incision underwater until follow-up. Drain care and drainage log/record as instructed by [facility] RN.  Medications: Resume all home medications AND Complete prescribed antibiotics even if/when feeling better/well. Complete prescribed Cipro (through and including 4/22). Continue Flagyl through and including 4/22. Continue Vancomycin through and including 5/2. For mild to moderate pain: acetaminophen (Tylenol) or ibuprofen/naproxen (if no kidney disease). Combining Tylenol with alcohol can substantially increase your risk of causing liver disease. Narcotic pain medications, if prescribed, can be used for severe pain, though may cause nausea, constipation, and drowsiness. Do not combine Tylenol and Percocet (or similar) within a 6 hour period as Percocet (and similar) contain(s) Tylenol. If you do not need the narcotic pain medication, you do not need to fill the prescription.  Call office [phone]) at any time if any questions, worsening pain, fevers/chills, bleeding, drainage from incision site, or other concerns.

## 2017-07-17 NOTE — Care Management (Signed)
Patient to discharge on oral vanc today.  Script called in and [facility] [place] [facility].  Price is $114.13.  Patient confirms that she will be able to afford medication at discharge.

## 2017-07-17 NOTE — Progress Notes (Signed)
[facility] CLINIC INFECTIOUS DISEASE PROGRESS NOTE Date of Admission:  07/02/2017     ID: Donna Hammond is a 67 y.o. female with ruptured appendicitis  Active Problems:   Acute perforated appendicitis   Enteritis due to Clostridium difficile   Subjective: Walking more, stools still loose, decreasing pain. On drain removed  ROS  Eleven systems are reviewed and negative except per hpi  Medications:  Antibiotics Given (last 72 hours)    Date/Time Action Medication Dose Rate   07/14/17 1506 Given   vancomycin (VANCOCIN) 50 mg/mL oral solution 125 mg 125 mg    07/14/17 1507 New Bag/Given   piperacillin-tazobactam (ZOSYN) IVPB 3.375 g 3.375 g 12.5 mL/hr   07/14/17 2043 New Bag/Given   piperacillin-tazobactam (ZOSYN) IVPB 3.375 g 3.375 g 12.5 mL/hr   07/14/17 2110 Given   vancomycin (VANCOCIN) 50 mg/mL oral solution 125 mg 125 mg    07/15/17 0510 Given   vancomycin (VANCOCIN) 50 mg/mL oral solution 125 mg 125 mg    07/15/17 0511 New Bag/Given   piperacillin-tazobactam (ZOSYN) IVPB 3.375 g 3.375 g 12.5 mL/hr   07/15/17 0916 Given   vancomycin (VANCOCIN) 50 mg/mL oral solution 125 mg 125 mg    07/15/17 1422 New Bag/Given   piperacillin-tazobactam (ZOSYN) IVPB 3.375 g 3.375 g 12.5 mL/hr   07/15/17 1428 Given   vancomycin (VANCOCIN) 50 mg/mL oral solution 125 mg 125 mg    07/15/17 1816 Given   vancomycin (VANCOCIN) 50 mg/mL oral solution 125 mg 125 mg    07/15/17 2037 New Bag/Given   piperacillin-tazobactam (ZOSYN) IVPB 3.375 g 3.375 g 12.5 mL/hr   07/15/17 2322 Given   vancomycin (VANCOCIN) 50 mg/mL oral solution 125 mg 125 mg    07/16/17 0607 New Bag/Given   piperacillin-tazobactam (ZOSYN) IVPB 3.375 g 3.375 g 12.5 mL/hr   07/16/17 1034 Given   vancomycin (VANCOCIN) 50 mg/mL oral solution 125 mg 125 mg    07/16/17 1429 New Bag/Given   piperacillin-tazobactam (ZOSYN) IVPB 3.375 g 3.375 g 12.5 mL/hr   07/16/17 1715 Given   vancomycin (VANCOCIN) 50 mg/mL oral solution 125 mg 125 mg     07/16/17 1900 Given   vancomycin (VANCOCIN) 50 mg/mL oral solution 125 mg 125 mg    07/16/17 2322 Given   vancomycin (VANCOCIN) 50 mg/mL oral solution 125 mg 125 mg    07/17/17 0249 New Bag/Given   piperacillin-tazobactam (ZOSYN) IVPB 3.375 g 3.375 g 12.5 mL/hr   07/17/17 1009 Given   vancomycin (VANCOCIN) 50 mg/mL oral solution 125 mg 125 mg    07/17/17 1010 New Bag/Given   piperacillin-tazobactam (ZOSYN) IVPB 3.375 g 3.375 g 12.5 mL/hr   07/17/17 1423 Given   vancomycin (VANCOCIN) 50 mg/mL oral solution 125 mg 125 mg      . acetaminophen  1,000 mg Oral Q6H  . enoxaparin (LOVENOX) injection  40 mg Subcutaneous Q24H  . feeding supplement (ENSURE ENLIVE)  237 mL Oral TID BM  . pantoprazole  40 mg Oral Daily  . vancomycin  125 mg Oral QID    Objective: Vital signs in last 24 hours: Temp:  [98.2 F (36.8 C)-99 F (37.2 C)] 98.4 F (36.9 C) (04/17 1300) Pulse Rate:  [76-95] 76 (04/17 1300) Resp:  [20-22] 20 (04/17 0555) BP: (119-133)/(57-70) 121/63 (04/17 1300) SpO2:  [93 %-99 %] 99 % (04/17 1300) Weight:  [55.8 kg (123 lb 0.3 oz)] 55.8 kg (123 lb 0.3 oz) (04/17 0302) Constitutional: She is oriented to person, place, and time. sitting up in chair  Mouth/Throat: Oropharynx is clear and moist. No oropharyngeal exudate.Cardiovascular: Normal rate, regular rhythm and normal heart sounds. Exam reveals no gallop and no friction rub. .  Pulmonary/Chest: Effort normal and breath sounds normal. No respiratory distress. He has no wheezes.  Abdominal: Soft. midline incision covered with honeycomb dressing, jp drain with ss drianage LymphShe has no cervical adenopathy.  Neurological: She is alert and oriented to person, place, and time.   Lab Results Recent Labs    07/15/17 0500 07/17/17 0442  WBC 10.6 8.0  HGB 9.5* 9.5*  HCT 27.9* 25.6*  NA 131*  --   K 3.9  --   CL 97*  --   CO2 28  --   BUN 6  --   CREATININE 0.40*  --     Microbiology: Results for orders placed or  performed during the hospital encounter of 07/02/17  C difficile quick scan w PCR reflex     Status: None   Collection Time: 07/05/17  2:02 PM  Result Value Ref Range Status   C Diff antigen NEGATIVE NEGATIVE Final   C Diff toxin NEGATIVE NEGATIVE Final   C Diff interpretation No C. difficile detected.  Final    Comment: Performed at [facility], [address] [place], [place] [address]  Aerobic/Anaerobic Culture (surgical/deep wound)     Status: None   Collection Time: 07/08/17  9:07 PM  Result Value Ref Range Status   Specimen Description   Final    WOUND Performed at [facility], [address] [place], [place] [address]    Special Requests   Final    NONE Performed at [facility], [address] [place], [place] [address]    Gram Stain   Final    FEW WBC PRESENT, PREDOMINANTLY PMN NO ORGANISMS SEEN    Culture   Final    No growth aerobically or anaerobically. Performed at [facility], [address] [place], [place] [address]    Report Status 07/14/2017 FINAL  Final    Studies/Results: No results found.  Assessment/Plan: Donna Hammond is a 67 y.o. female admitted with ruptured appendicitis following a recent dx of C diff being treated as otpt with vancomycin.  4/4 no fevers, wbc 9, pain a little better 4/9 - s/p surg 4/8 - 4/11- no fevers, passed flatus 4/12 - no fevers, advancing diet.  4/15- no fevers, wbc down to 10  4/16 - better day by day 4/17 - for dc today  Recommendations Appendicitis- cont zosyn  - cx  no growth to date  -Cont  fluconazole  I would rec a total 14 days of abx therapy following surgery given the severity of the infection and the duration of rupture prior to surgery. Stop date would be 4/22 but when ready for dc can send on oral augmentin.  If she has GI side effect can change to oral cipro and flagyl  C diff -diarrhea improved and fu C diff neg -  cont oral vanco. Will need 10 days more of  oral vanco when she finishes her abx course for the appendicitis.   Thank you very much for the consult. Will follow with you.  Mick SellDavid P Mervyn Pflaum   07/17/2017, 2:25 PM

## 2017-07-17 NOTE — Telephone Encounter (Signed)
Patient is schedule to come in next week for a follow up, patient needs a CT abdomen and pelvis either prior to or after follow-up appointment please call and advise.

## 2017-07-17 NOTE — Telephone Encounter (Signed)
Spoke with patient regarding CT scan appointment 07/23/17 @ 3 pm. Patient to pick up prep priro to appointment. Nothing by mouth 4 hours prior.

## 2017-07-19 NOTE — Discharge Summary (Signed)
Physician Discharge Summary  Patient ID: Donna Hammond MRN: [id number] DOB/AGE: 07-14-1950 67 y.o.  Admit date: 07/02/2017 Discharge date: 07/17/2017  Admission Diagnoses:  Discharge Diagnoses:  Active Problems:   Acute perforated appendicitis   Enteritis due to Clostridium difficile   Discharged Condition: fair  Hospital Course: 68 y.o. female recently diagnosed with and oral antibiotic treatment initiated for clostridium difficile colitis presented to [facility] ED for worsening Right-sided abdominal pain and fever. Workup was found to be significant for CT imaging demonstrating perforated appendicitis with large volume free fluid without consolidated abscess. Patient was initially treated antibiotics for both perforated appendicitis and c diff colitis and experienced both clinical improvement with improved abdominal pain and lab improvement with normalization of her WBC. TPN was also started. However, on hospital day 7, patient complained of worsened abdominal pain, which prompted repeat CT and WBC, which was found to again be elevated to 17.6, for which patient was advised surgical intervention. Informed consent was obtained and documented, and patient underwent exploratory laparotomy with open appendectomy and drainage/washout of her peritoneal cavity Excell Seltzer, 07/08/2017).  Post-operatively, patient experienced prolonged ileus, which then slowly improved along with her pain and resolution again of leukocytosis. Advancement of patient's diet and ambulation were well-tolerated. The remainder of patient's hospital course was essentially unremarkable, and discharge planning was initiated accordingly with patient safely able to be discharged home with appropriate discharge instructions, antibiotics for both perforated appendicitis and additionally for c diff infection, pain control, and outpatient surgical follow-up after all of her her and family's questions were answered to their expressed  satisfaction.  Consults: ID  Significant Diagnostic Studies: labs: WBC 8.0 (peak 07/09/2017: 21.6) and radiology: CT scan: perforated appendicitis with large volume non-consolidated free fluid  Treatments: IV hydration, antibiotics: Zosyn, metronidazole, oral vancomycin, and fluconazole, analgesia, TPN and surgery: exploratory laparotomy with open appendectomy and drainage/washout of peritoneal cavity Excell Seltzer, 07/08/2017)  Discharge Exam: Blood pressure 121/63, pulse 76, temperature 98.4 F (36.9 C), temperature source Oral, resp. rate 20, height 5\' 2"  (1.575 m), weight 123 lb 0.3 oz (55.8 kg), SpO2 99 %. General appearance: alert, cooperative and no distress GI: abdomen soft, no longer tender, and non-distended with incision well-approximated except where few staples removed near base of incision without surrounding erythema or drainage, LLQ peritoneal drain well-secured with clear pink fluid  Disposition:    Allergies as of 07/17/2017      Reactions   Levaquin [levofloxacin In D5w] Swelling   tingling      Medication List    TAKE these medications   amLODipine 10 MG tablet Commonly known as:  NORVASC Take 10 mg by mouth daily.   ciprofloxacin 500 MG tablet Commonly known as:  CIPRO Take 1 tablet (500 mg total) by mouth 2 (two) times daily for 5 days.   dicyclomine 20 MG tablet Commonly known as:  BENTYL Take 20 mg by mouth every 6 (six) hours.   feeding supplement (ENSURE ENLIVE) Liqd Take 237 mLs by mouth 3 (three) times daily between meals.   lisinopril 20 MG tablet Commonly known as:  PRINIVIL,ZESTRIL Take 20 mg by mouth daily.   metroNIDAZOLE 500 MG tablet Commonly known as:  FLAGYL Take 1 tablet (500 mg total) by mouth 4 (four) times daily for 6 days.   oxyCODONE 5 MG immediate release tablet Commonly known as:  Oxy IR/ROXICODONE Take 1 tablet (5 mg total) by mouth every 3 (three) hours as needed for severe pain.   vancomycin 125 MG capsule Commonly known as:  VANCOCIN Take 1 capsule (125 mg total) by mouth 4 (four) times daily.      Follow-up Information    [facility]. Go on 07/25/2017.   Specialty:  General Surgery Why:  with expected repeat CT abdomen and pelvis either prior to or after follow-up appointment. Go at 11:00am. Contact information: [address] [place] [id number] [phone]          Signed: Ancil LinseyJason Evan Elidia Bonenfant 07/19/2017, 6:44 PM

## 2017-07-22 ENCOUNTER — Telehealth: Payer: Self-pay | Admitting: Surgery

## 2017-07-22 NOTE — Telephone Encounter (Signed)
Patient has some drainage in incision site - yellow fluid. Please advise

## 2017-07-22 NOTE — Telephone Encounter (Signed)
Patient has called again asking for one of the nurses to call him.

## 2017-07-22 NOTE — Telephone Encounter (Signed)
Patient concerned about yellow fluid drainage. Let him know this was most likely serosanious fluid, normal. Denies any pus or bloody drainage. Denies fever, although she does have some discomfort and asked if staples would be removed at her appointment on Thursday. The drain is draining about 5 ml daily. Patient will finish antibiotic 07/23/17. Patient verbalized understanding.

## 2017-07-23 ENCOUNTER — Ambulatory Visit: Payer: Medicare Other

## 2017-07-23 ENCOUNTER — Telehealth: Payer: Self-pay | Admitting: Pediatrics

## 2017-07-23 NOTE — Telephone Encounter (Signed)
Per Emmi report patient responded to wounds healing well... No.  Per patient she states that she was having drainage from the incision site, however she called surgical center yesterday and her concerns were addressed.  Patient states that she has a follow up appointment schedule for Thursday.  Patient states she has no other concerns.    Per Epic documentation from surgery center "Patient concerned about yellow fluid drainage. Let him know this was most likely serosanious fluid, normal. Denies any pus or bloody drainage. Denies fever, although she does have some discomfort and asked if staples would be removed at her appointment on Thursday. The drain is draining about 5 ml daily. Patient will finish antibiotic 07/23/17."

## 2017-07-25 ENCOUNTER — Encounter: Payer: Self-pay | Admitting: Surgery

## 2017-07-25 ENCOUNTER — Ambulatory Visit (INDEPENDENT_AMBULATORY_CARE_PROVIDER_SITE_OTHER): Payer: Medicare Other | Admitting: Surgery

## 2017-07-25 DIAGNOSIS — Z09 Encounter for follow-up examination after completed treatment for conditions other than malignant neoplasm: Secondary | ICD-10-CM

## 2017-07-25 NOTE — Patient Instructions (Addendum)
Finish your Vancomycin prescription. Follow up here in 2 weeks on 08/08/17 with Dr Excell Seltzerooper.

## 2017-07-25 NOTE — Progress Notes (Signed)
07/25/2017  HPI: Patient is s/p exploratory laparotomy with appendectomy and washout of intraabdominal abscess.  She had presented with a recent history of c-diff colitis, with complication of ruptured appendicitis.  Conservative management initially improved her condition but then her WBC worsened again and was taken to the OR.  Post-op, she had a post-op ileus requiring NG tube placement and was eventually discharged on 4/17.  She has completed her antibiotic course for her appendicitis, and is still on oral vancomycin for her c-diff colitis per ID recommendations.  Patient reports some drainage from the midline incision and some overall soreness.  She is having "pudding" like stools but no overt diarrhea.  Her drain is having about 5 ml per day of output.    Vital signs: BP 125/74, HR 93, Temp 98.2, RR 16  Physical Exam: Constitutional: No acute distress Abdomen:  Soft, nondistended, appropriately sore to palpation over midline incision.  Staples in place, no evidence of infection.  Small 2 cm area of inferior portion of wound which is open with serous drainage on gauze.  JP with serous fluid.  Staples removed and changed to steri strips, JP removed.  Silver nitrate applied to inferior open wound.  Dry gauze/tape applied. Skin:  Macular rash over abdomen and back, no drainage.  Assessment/Plan: 67 yo female s/p exlap with open appendectomy and washout of intraabdominal abscess.  --patient is to continue her vancomycin until completed --dry gauze dressing change daily as needed, but anticipate her wounds will heal soon.  --No complications from JP removal and staple removal. --follow up in 2 weeks to assess her progress.   Howie IllJose Luis Jayson Waterhouse, MD [facility]

## 2017-07-31 ENCOUNTER — Telehealth: Payer: Self-pay | Admitting: Surgery

## 2017-07-31 NOTE — Telephone Encounter (Signed)
Spoke with patient. Ok to take something for sore throat.

## 2017-07-31 NOTE — Telephone Encounter (Signed)
Patients calling said she has a sore throat and is asking if she could take some medication for it. Please call patient and advise.

## 2017-08-08 ENCOUNTER — Ambulatory Visit (INDEPENDENT_AMBULATORY_CARE_PROVIDER_SITE_OTHER): Payer: Medicare Other | Admitting: Surgery

## 2017-08-08 ENCOUNTER — Encounter: Payer: Self-pay | Admitting: Surgery

## 2017-08-08 VITALS — BP 128/74 | HR 102 | Temp 97.8°F | Wt 112.0 lb

## 2017-08-08 DIAGNOSIS — Z09 Encounter for follow-up examination after completed treatment for conditions other than malignant neoplasm: Secondary | ICD-10-CM

## 2017-08-08 NOTE — Progress Notes (Signed)
Outpatient postop visit  08/08/2017  Donna Hammond is an 67 y.o. female.    Procedure: Exploratory laparotomy, appendectomy, evacuation of abscesses.  CC minimal incisional pain  HPI: This a patient who underwent a exploratory laparotomy for ruptured appendicitis with abscesses who also had C. difficile colitis at the same time. No f/c, no n/v.  Patient spent 2-1/2 weeks in the hospital being treated for both C. difficile and a ruptured appendix with abscesses.  She is feeling well at this point but does have some incisional pain.  No nausea vomiting fevers or chills.  Medications reviewed.    Physical Exam:  There were no vitals taken for this visit.    PE: midline wound clean, dry, no erythema Soft nontender abdomen   Assessment/Plan:  This patient who had developed acute appendicitis while experiencing C. difficile colitis.  She had abscesses and underwent a exploratory laparotomy with evacuation of the abscesses and appendectomy.  She had a prolonged ileus postoperatively.  Pathology is reviewed.  Patient doing quite well at this time she is moving or has moved to [place] and reestablishing with a new primary care physician.  We offered to provide records etc. as needed.  She will follow-up on an as-needed basis.  Lattie Haw, MD, FACS

## 2017-08-08 NOTE — Patient Instructions (Signed)

## 2017-08-13 ENCOUNTER — Telehealth: Payer: Self-pay

## 2017-08-13 MED ORDER — VANCOMYCIN HCL 125 MG PO CAPS: 125.0000 mg | ORAL_CAPSULE | Freq: Four times a day (QID) | ORAL | 0 refills | Status: AC

## 2017-08-13 MED ORDER — METRONIDAZOLE 500 MG PO TABS: 500.0000 mg | ORAL_TABLET | Freq: Three times a day (TID) | ORAL | 0 refills | Status: AC

## 2017-08-13 NOTE — Telephone Encounter (Signed)
Patient called stating that she started to have abdominal pain and diarrhea after they (her and husband) left to go back home to [place]. Due to her feeling sick, she went to the ED and was tested for C-Diff. She received results yesterday on MyChart and she had tried to contact the ED physician for a prescription. However, she had not received a call and continues to feel sick with abdominal pain and diarrhea. I told her that I would call our surgeon on call and ask if we could send her antibiotics at [facility]. I then called and spoke to Dr. Everlene Farrier and explained what had happened. He stated that he knew the patient well and authorized me to send patient two prescriptions for her to take for two weeks. Please see medication list. I then called the patient that her [facility] [place]. Patient was grateful and had no further questions.

## 2019-08-04 IMAGING — CR DG CHEST 2V
1 series · 2 of 2 positions shown · non-contrast
Comparison: None.

CLINICAL DATA: Epigastric chest pain since this evening radiating
to back.

EXAM:
CHEST - 2 VIEW

[Series 1: dg chest 2 view · 0.14mm/px · 2 of 2 slices shown]
[im 1/2]
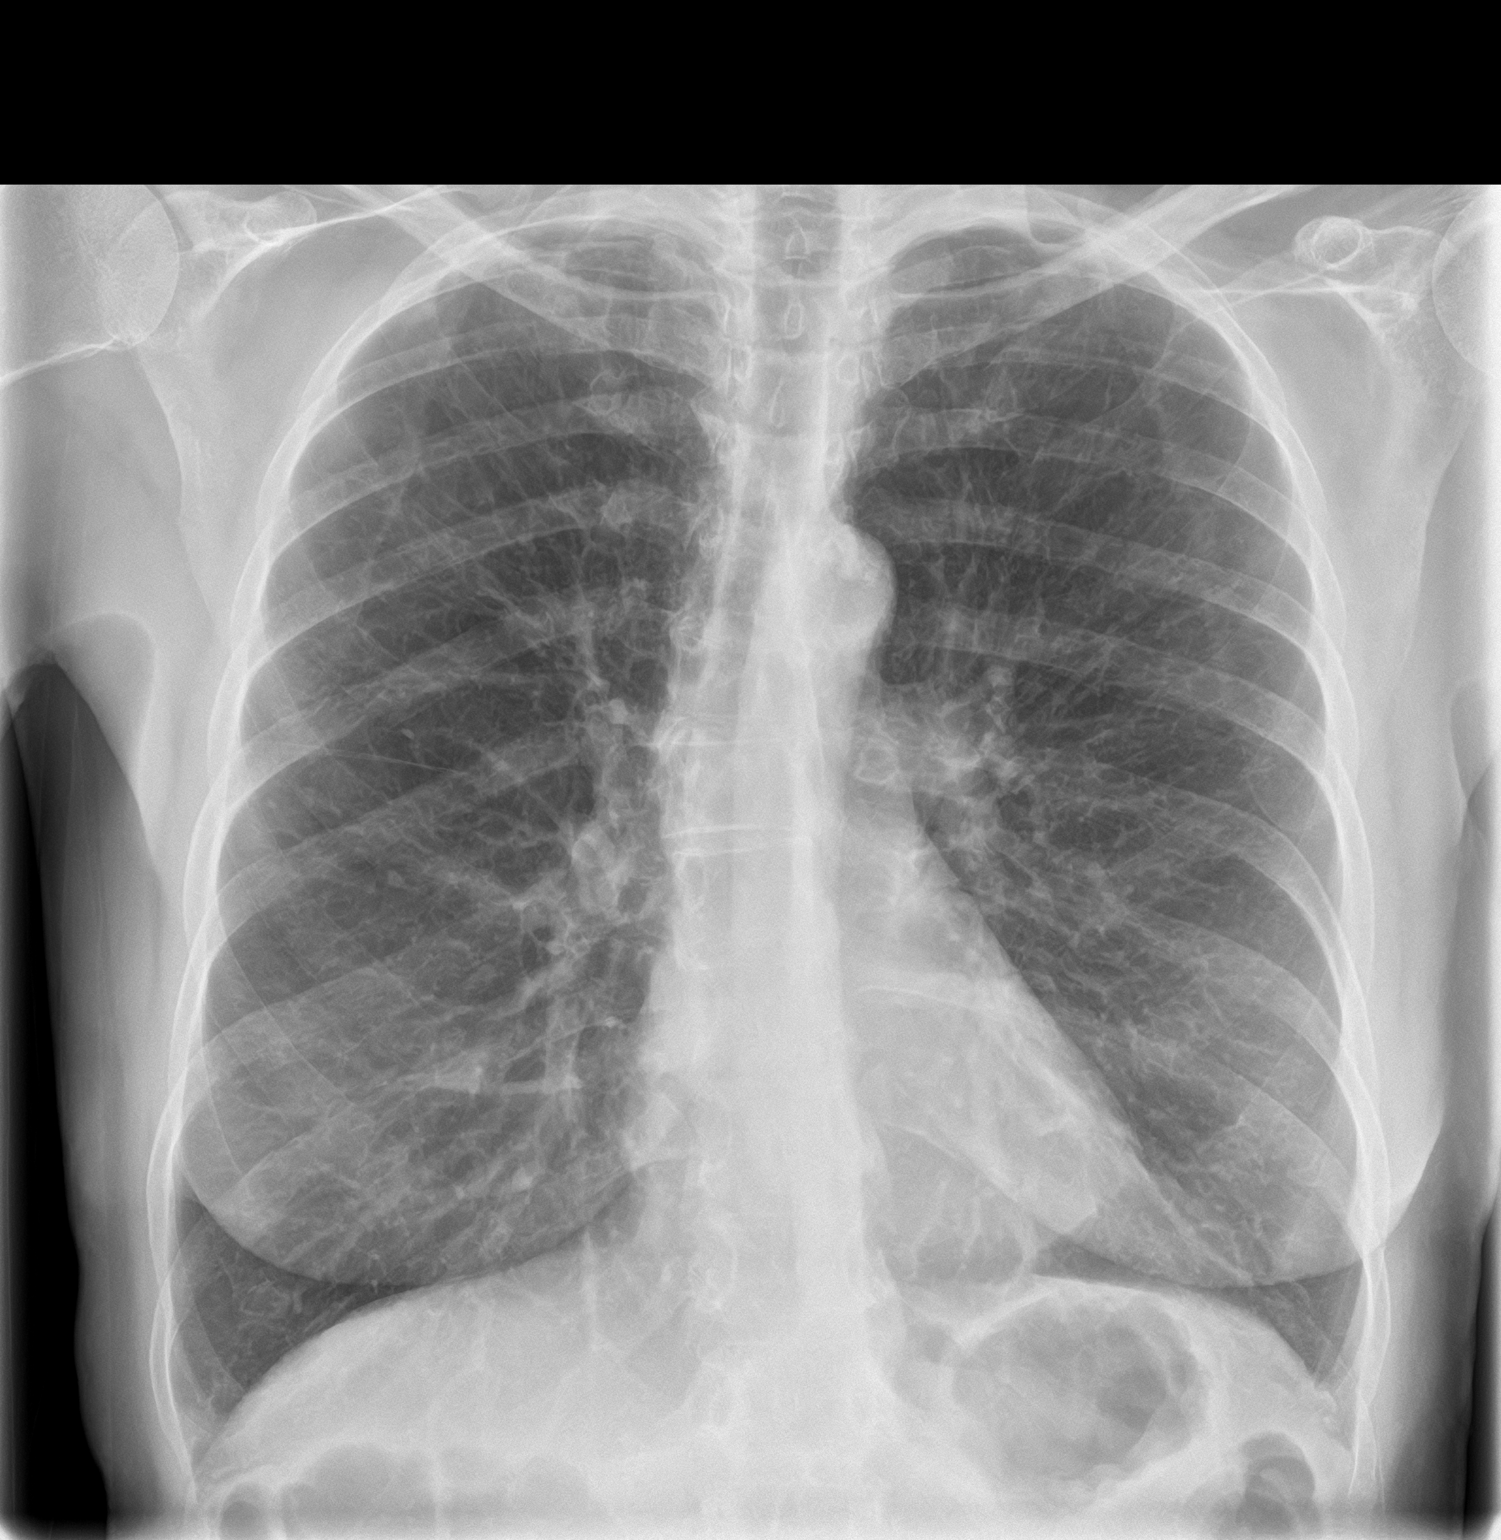
[im 2/2]
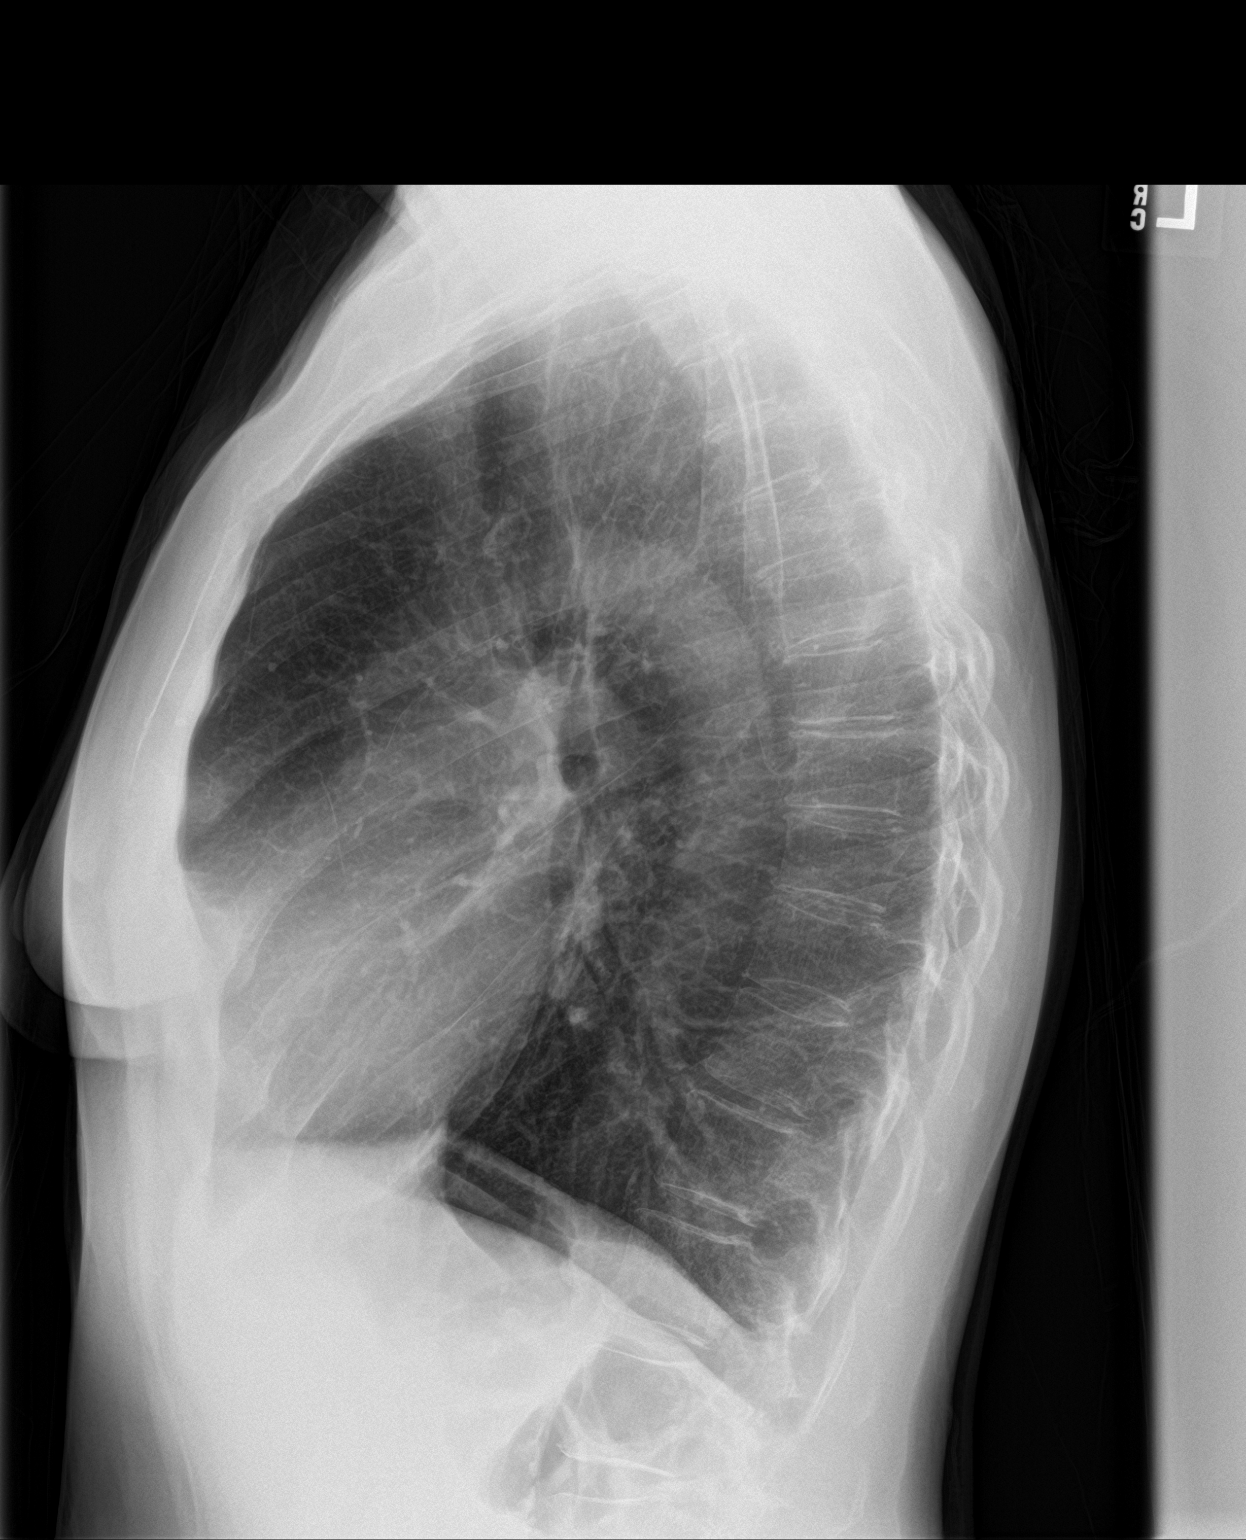

[2 of 2 positions shown; findings below may reference images not displayed]

FINDINGS: Cardiomediastinal silhouette is normal. No pleural effusions or
focal consolidations. Mild hyperinflation and chronic interstitial
changes. Trachea projects midline and there is no pneumothorax. Soft
tissue planes and included osseous structures are non-suspicious.
IMPRESSION: .  No focal consolidation.  COPD

## 2019-08-09 IMAGING — CT CT ABD-PELV W/ CM
2 of 5 series · 16 of 46 positions shown, 18 images · IV contrast (APPLIED)
Comparison: 07/02/2017

CLINICAL DATA: Abdominal pain and fever. Perforated appendicitis. C
difficile colitis. Suspected abscess.

EXAM:
CT ABDOMEN AND PELVIS WITH CONTRAST
TECHNIQUE: Multidetector CT imaging of the abdomen and pelvis was performed
using the standard protocol following bolus administration of
intravenous contrast.
CONTRAST:  100mL OMNIPAQUE IOHEXOL 300 MG/ML  SOLN

[Series 2: routine abd/pel with · axial · 0.74mm/px · z∈[-318,+57]mm · 13 of 85 slices shown, 15 images]
[im 5/85  soft-tissue]
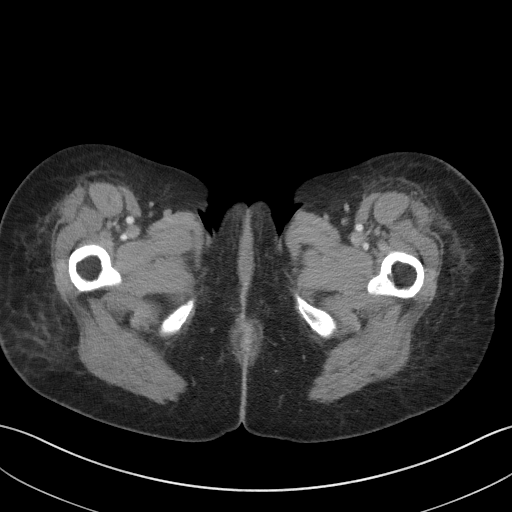
[im 5/85  bone]
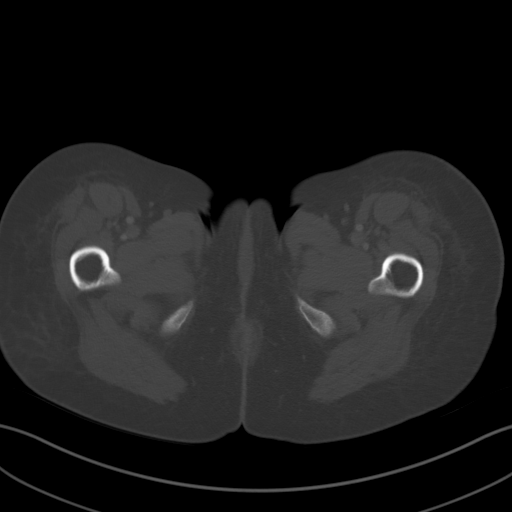
[im 10/85  soft-tissue]
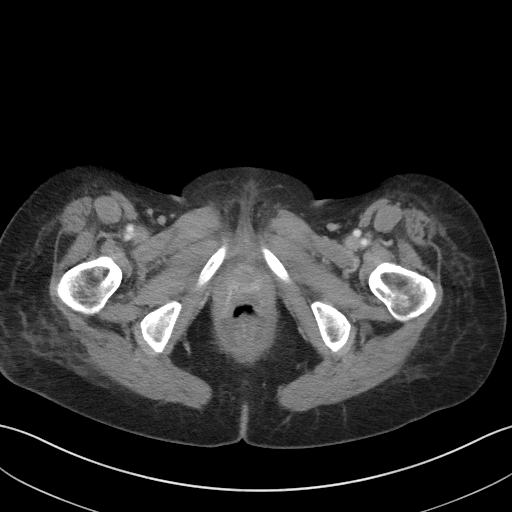
[im 20/85  soft-tissue]
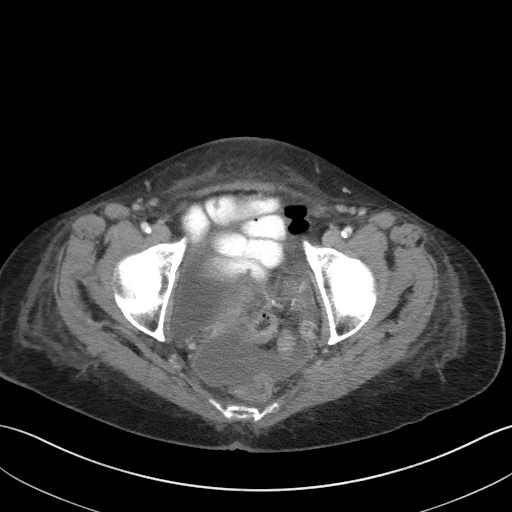
[im 25/85  soft-tissue]
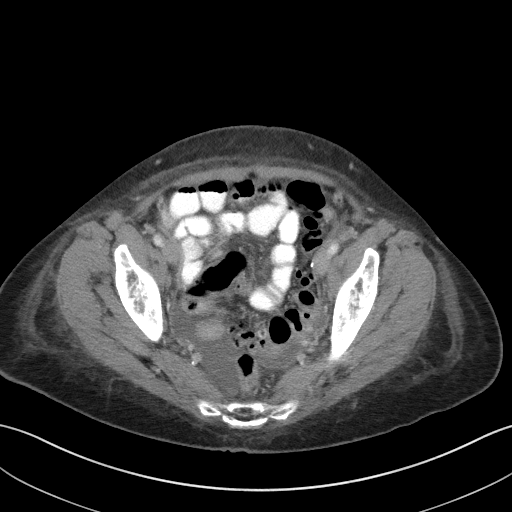
[im 30/85  soft-tissue]
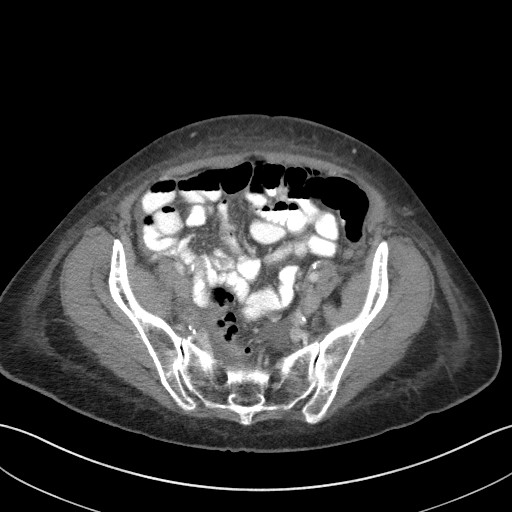
[im 35/85  soft-tissue]
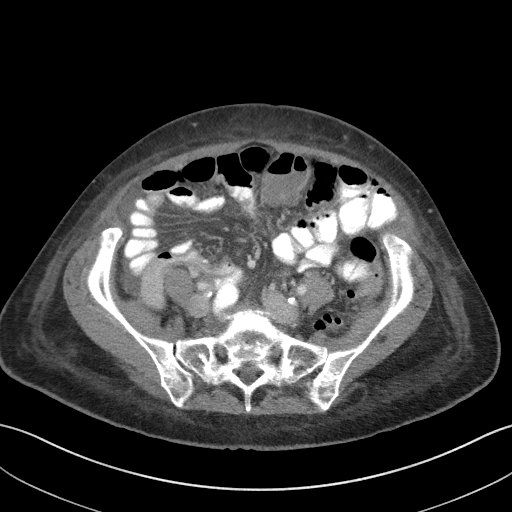
[im 45/85  soft-tissue]
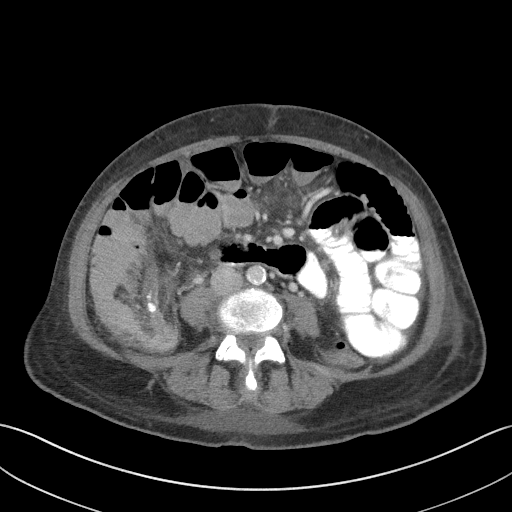
[im 50/85  soft-tissue]
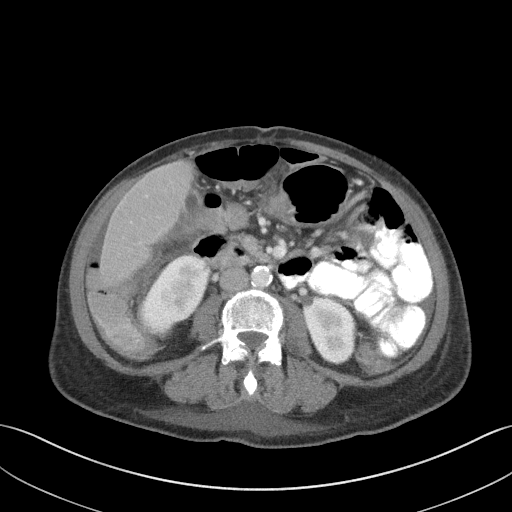
[im 55/85  soft-tissue]
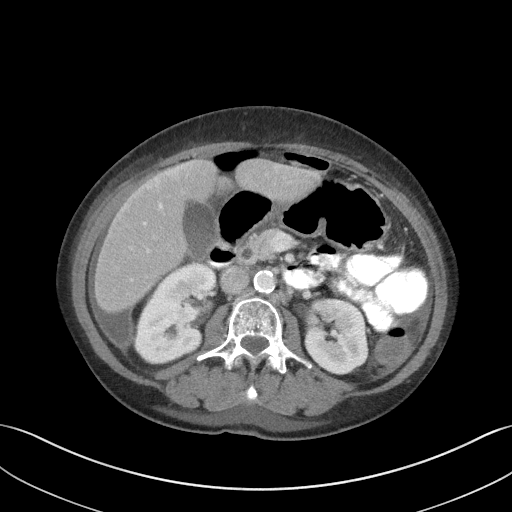
[im 55/85  bone]
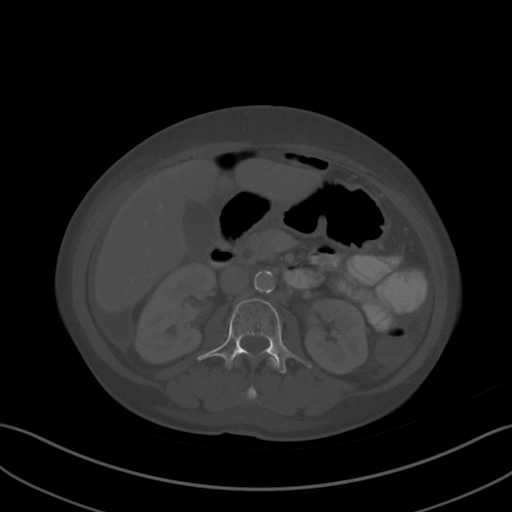
[im 60/85  soft-tissue]
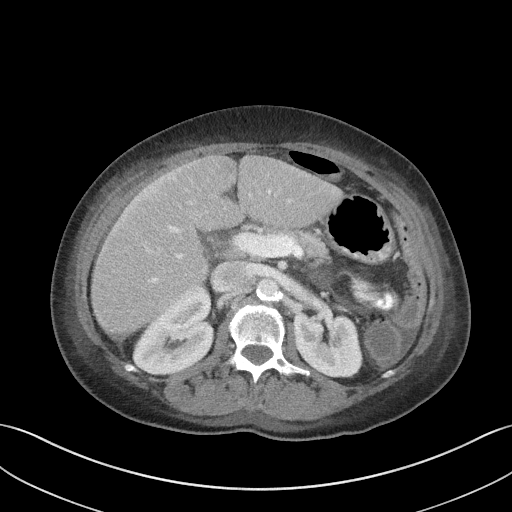
[im 65/85  soft-tissue]
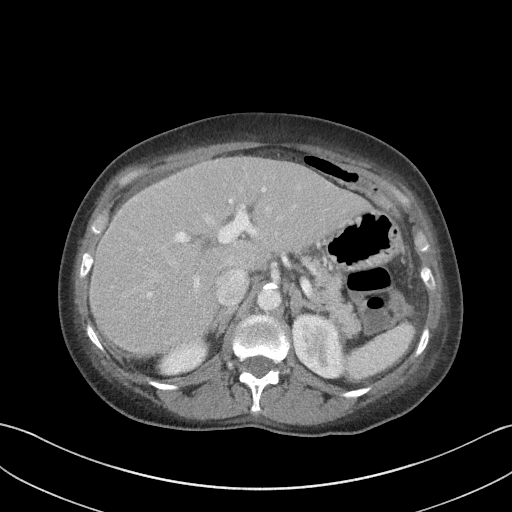
[im 75/85  soft-tissue]
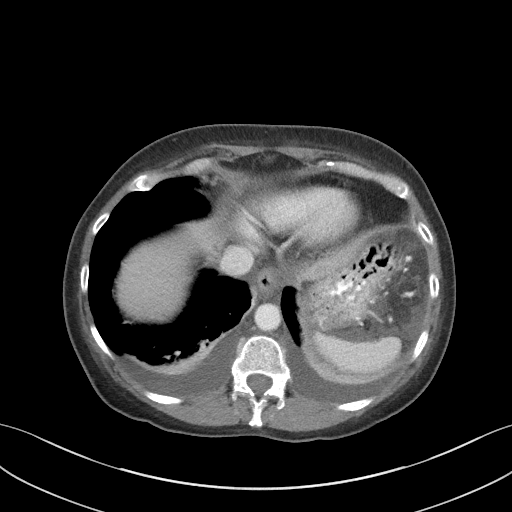
[im 80/85  soft-tissue]
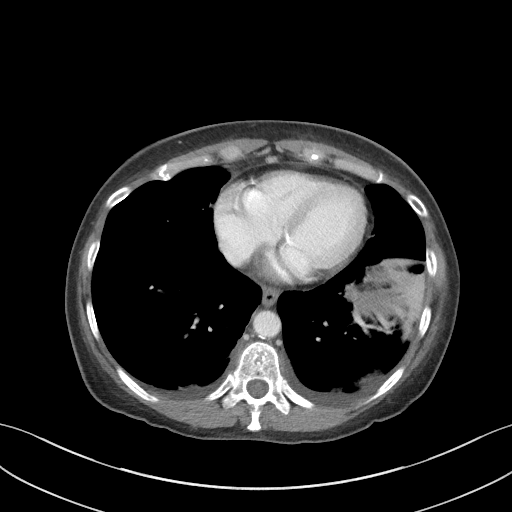

[Series 6: coronal st · coronal · 0.72mm/px · 3 of 80 slices shown]
[im 27/80  soft-tissue]
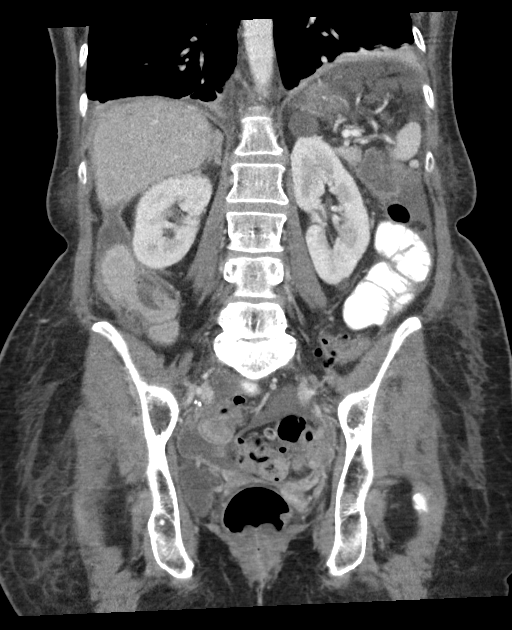
[im 36/80  soft-tissue]
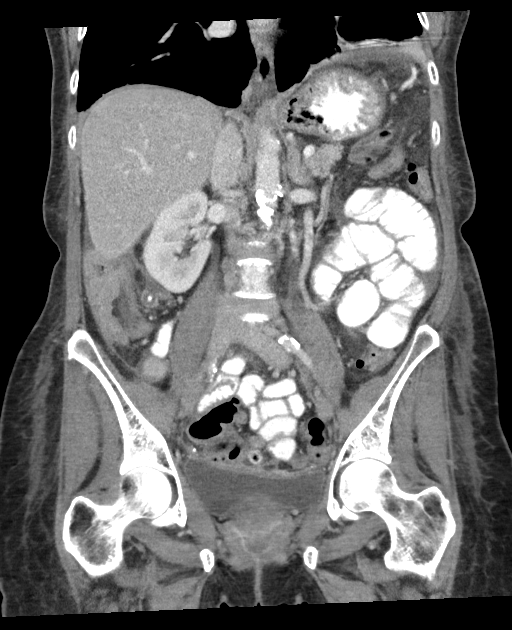
[im 44/80  soft-tissue]
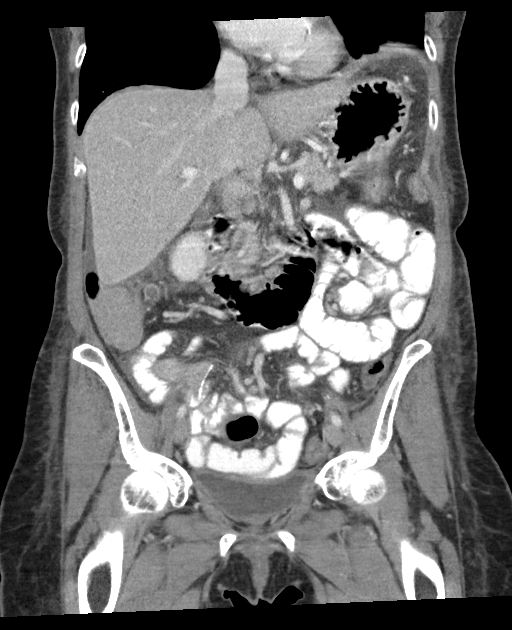

[16 of 46 positions shown; findings below may reference images not displayed]

FINDINGS: Lower Chest: New small bilateral pleural effusions and bibasilar
atelectasis.

Hepatobiliary: No hepatic masses identified. Gallbladder is
unremarkable.

Pancreas:  No mass or inflammatory changes.

Spleen: Within normal limits in size and appearance.

Adrenals/Urinary Tract: No masses identified. A few small left renal
cysts are again noted. No evidence of hydronephrosis. Unremarkable
unopacified urinary bladder.

Stomach/Bowel: Findings of acute appendicitis show no significant
change. Several extraluminal appendicoliths and a tiny air bubble
again seen in the periappendiceal soft tissues, consistent with
ruptured appendicitis. No loculated abscess is seen, however there
is increase in small amount of free fluid and peritoneal enhancement
seen in the pelvic cul-de-sac, right paracolic gutter, and bilateral
upper quadrants. No evidence of free intraperitoneal air or bowel
obstruction. Sigmoid diverticulosis is again noted, without evidence
of diverticulitis.

Vascular/Lymphatic: No pathologically enlarged lymph nodes. No
abdominal aortic aneurysm. Aortic atherosclerosis.

Reproductive: Prior hysterectomy noted. Adnexal regions are
unremarkable in appearance.

Other:  None.

Musculoskeletal:  No suspicious bone lesions identified.
IMPRESSION: Persistent findings of ruptured appendicitis, with mild increase in
free fluid and peritoneal enhancement in the pelvis, right paracolic
gutter, and bilateral upper quadrants. No focal abscess identified.

New small bilateral pleural effusions and bibasilar atelectasis.
